# Patient Record
Sex: Female | Born: 1985 | Race: White | Hispanic: No | Marital: Single | Smoking: Never smoker
Health system: Southern US, Community
[De-identification: ages and names within clinical notes are randomized; demographics above are authoritative.]

## PROBLEM LIST (undated history)

## (undated) DIAGNOSIS — K5090 Crohn's disease, unspecified, without complications: Secondary | ICD-10-CM

## (undated) DIAGNOSIS — Z862 Personal history of diseases of the blood and blood-forming organs and certain disorders involving the immune mechanism: Secondary | ICD-10-CM

## (undated) DIAGNOSIS — M1990 Unspecified osteoarthritis, unspecified site: Secondary | ICD-10-CM

## (undated) HISTORY — DX: Crohn's disease, unspecified, without complications: K50.90

## (undated) HISTORY — DX: Unspecified osteoarthritis, unspecified site: M19.90

## (undated) HISTORY — DX: Personal history of diseases of the blood and blood-forming organs and certain disorders involving the immune mechanism: Z86.2

---

## 2007-11-17 ENCOUNTER — Encounter: Admission: RE | Admit: 2007-11-17 | Discharge: 2007-11-17 | Payer: Self-pay | Admitting: Sports Medicine

## 2007-11-18 ENCOUNTER — Encounter: Admission: RE | Admit: 2007-11-18 | Discharge: 2007-11-18 | Payer: Self-pay | Admitting: Sports Medicine

## 2008-02-06 ENCOUNTER — Inpatient Hospital Stay (HOSPITAL_COMMUNITY): Admission: EM | Admit: 2008-02-06 | Discharge: 2008-02-15 | Payer: Self-pay | Admitting: Emergency Medicine

## 2008-02-06 HISTORY — PX: BOWEL RESECTION: SHX1257

## 2008-02-09 ENCOUNTER — Encounter (INDEPENDENT_AMBULATORY_CARE_PROVIDER_SITE_OTHER): Payer: Self-pay | Admitting: General Surgery

## 2008-05-01 IMAGING — CT CT PELVIS LIMITED W/O CM
2 of 4 series · 15 of 42 positions shown, 19 images · non-contrast
Comparison: none

CLINICAL DATA: Crohn's disease. Pelvic abscess.

[Series 2: abd pelvis · axial · 0.70mm/px · z∈[-245,-102]mm · 12 of 58 slices shown, 16 images]
[im 5/58  soft-tissue]
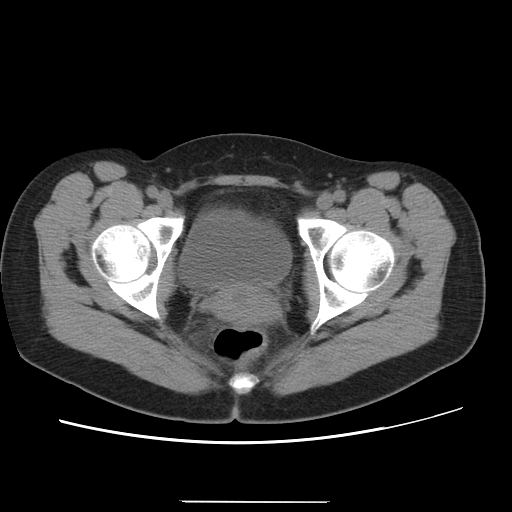
[im 5/58  bone]
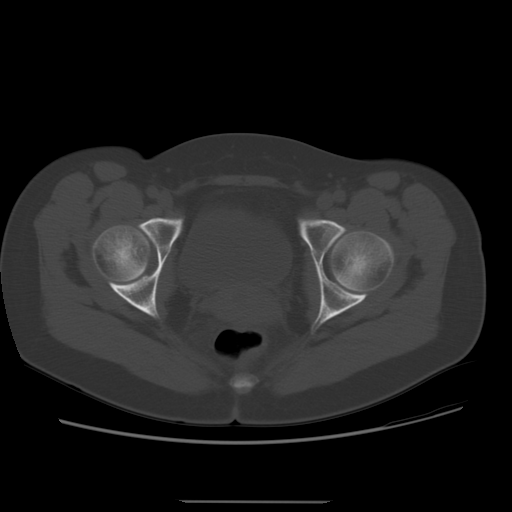
[im 10/58  soft-tissue]
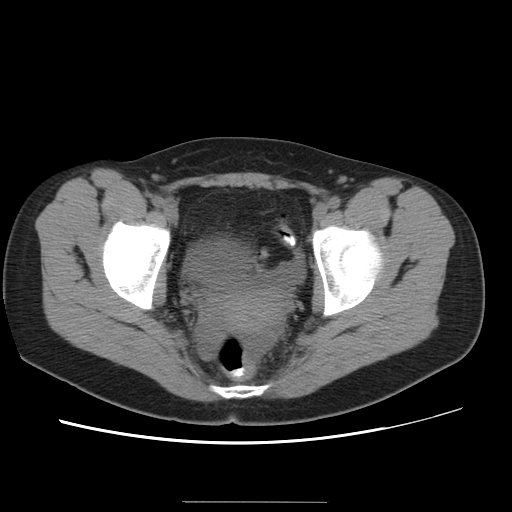
[im 15/58  soft-tissue]
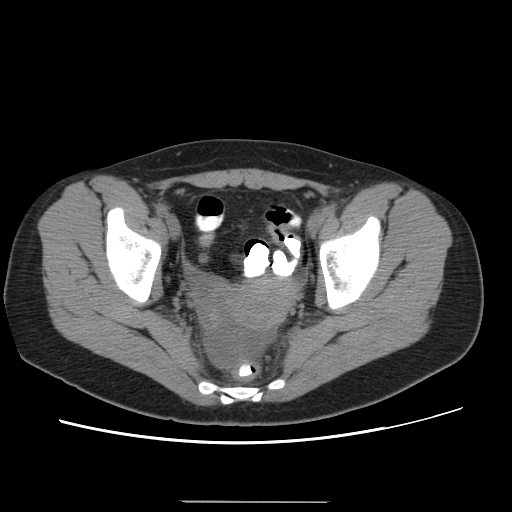
[im 20/58  soft-tissue]
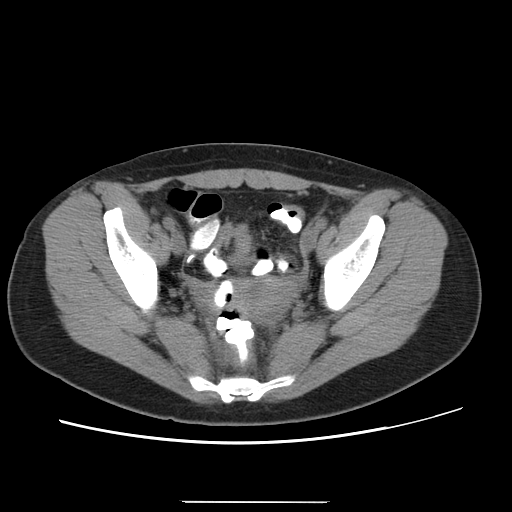
[im 25/58  soft-tissue]
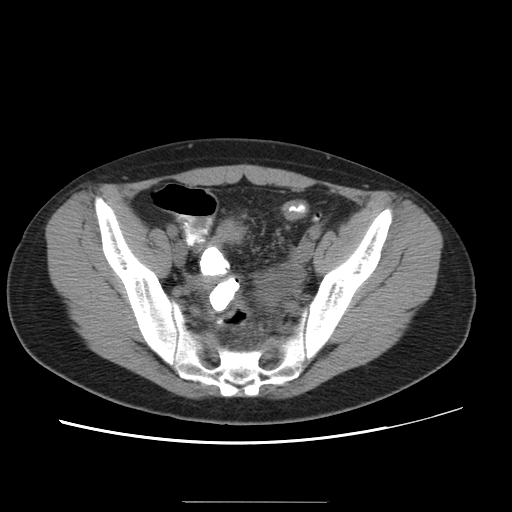
[im 33/58  soft-tissue]
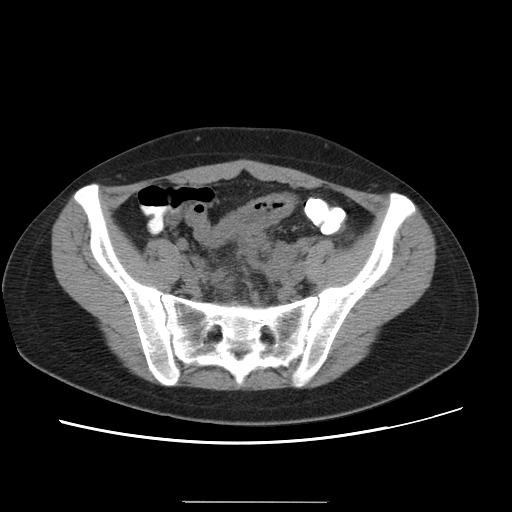
[im 38/58  soft-tissue]
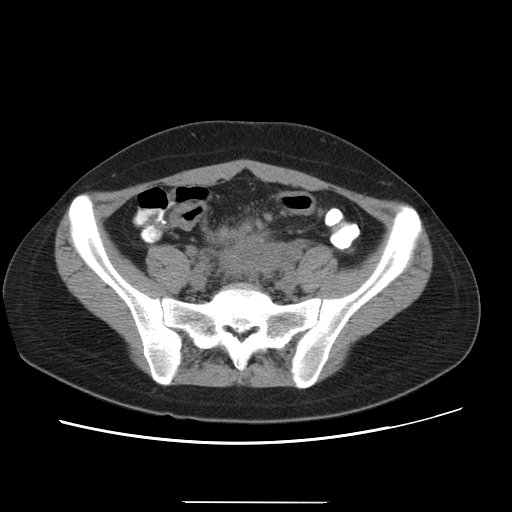
[im 43/58  soft-tissue]
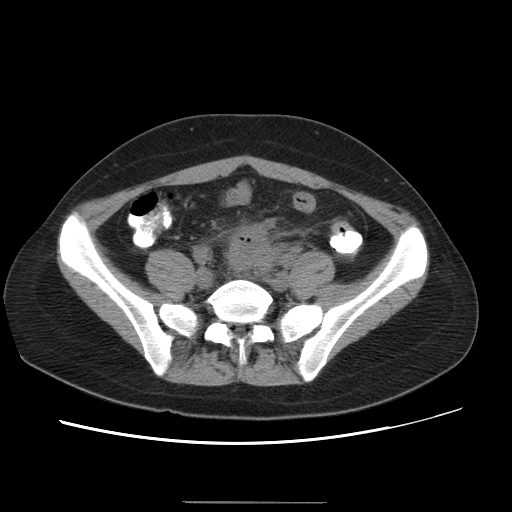
[im 48/58  soft-tissue]
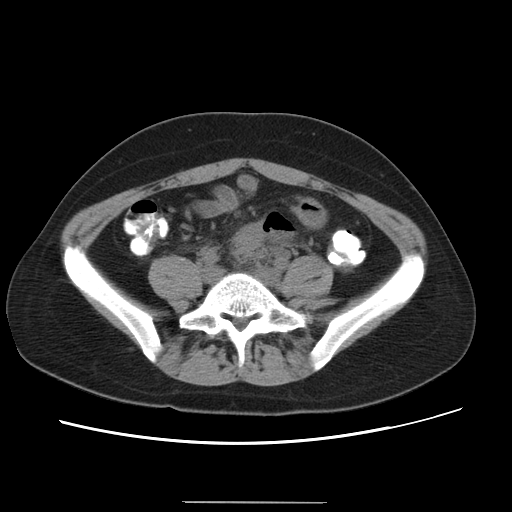
[im 48/58  lung]
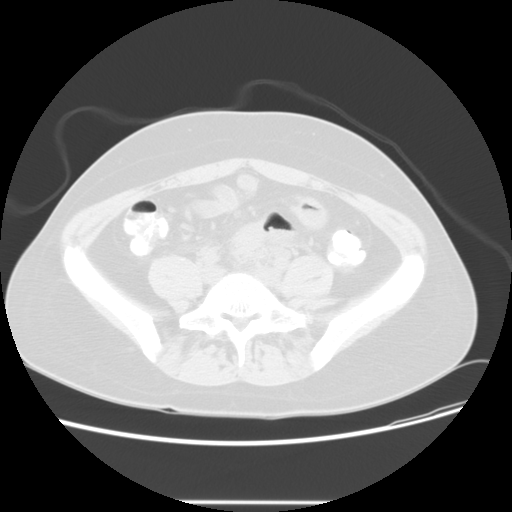
[im 48/58  bone]
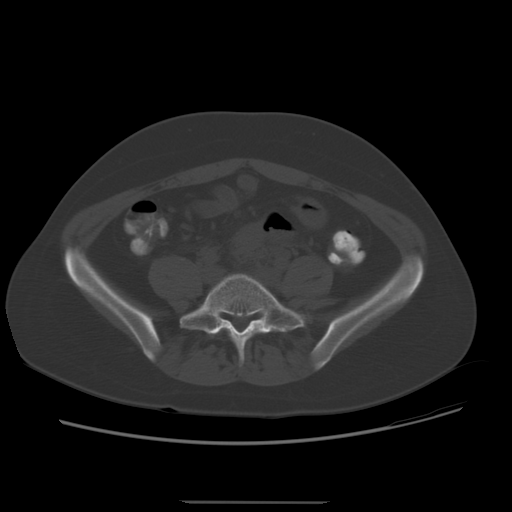
[im 50/58  lung]
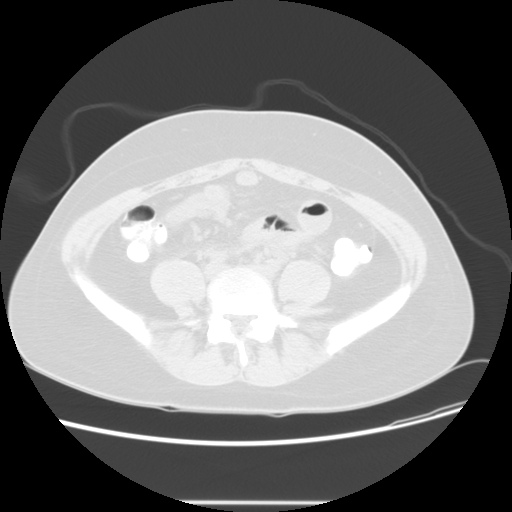
[im 53/58  soft-tissue]
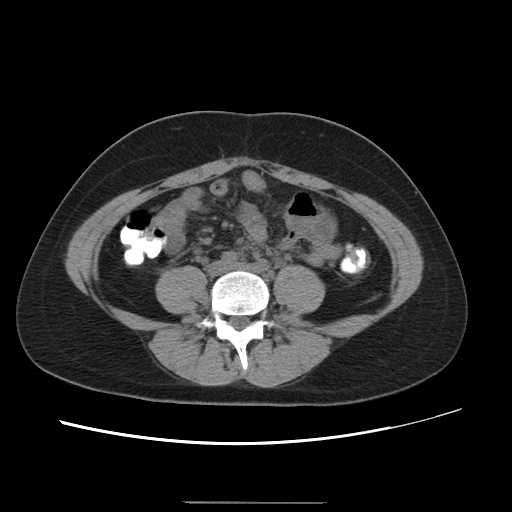
[im 53/58  lung]
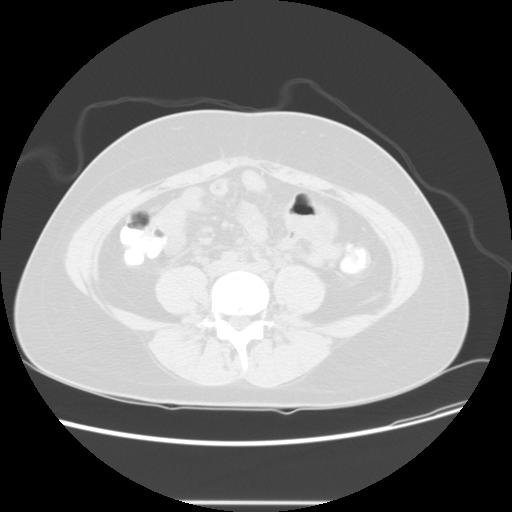
[im 55/58  lung]
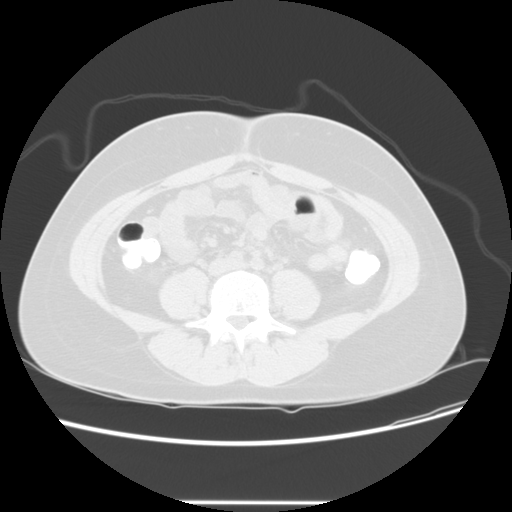

[Series 400: reformatted · sagittal · 0.70mm/px · 3 of 149 slices shown]
[im 30/149  soft-tissue]
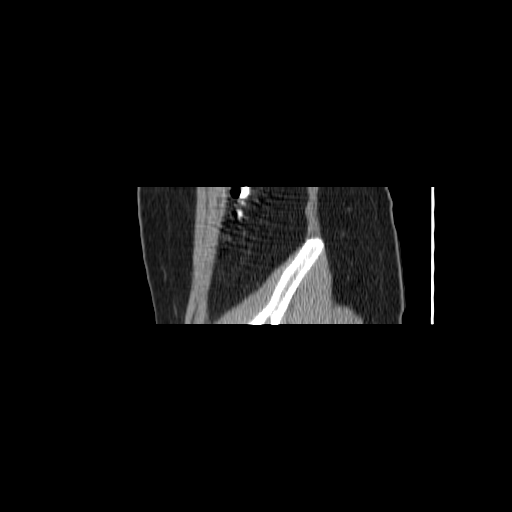
[im 60/149  soft-tissue]
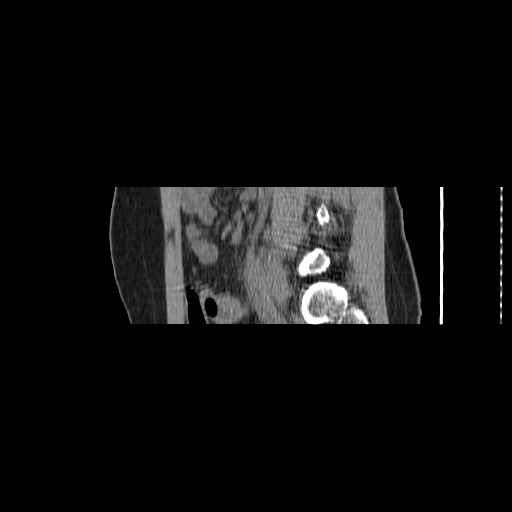
[im 89/149  soft-tissue]
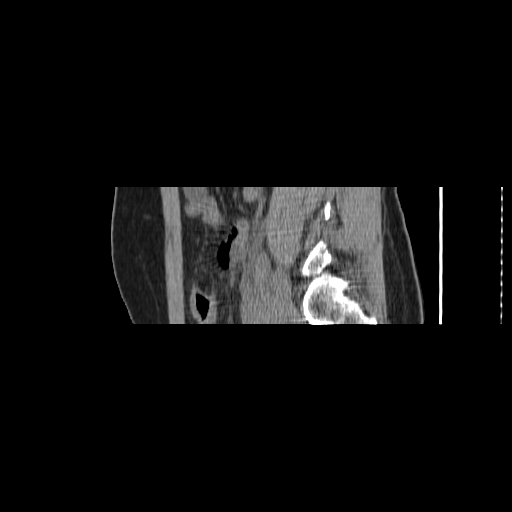

[15 of 42 positions shown; findings below may reference images not displayed]

CT pelvis limited without contrast:

Select axial scans were obtained through the pelvis in contemplation of
percutaneous abscess drainage. There has been interval decrease in size of the
central pelvic gas and fluid collection since the previous scan. The central low
attenuation component of the collection measures less than 2 cm maximum
transverse diameter. There are multiple loops of bowel overlying and surrounding
the residual collection. There is a small amount of free fluid in the low
pelvis.
IMPRESSION: 1. Interval decrease in size of central pelvic abscess, now too small to warrant
percutaneous drain catheter placement.

## 2010-01-10 ENCOUNTER — Ambulatory Visit: Payer: Self-pay | Admitting: Diagnostic Radiology

## 2010-01-10 ENCOUNTER — Ambulatory Visit (HOSPITAL_BASED_OUTPATIENT_CLINIC_OR_DEPARTMENT_OTHER): Admission: RE | Admit: 2010-01-10 | Discharge: 2010-01-10 | Payer: Self-pay | Admitting: Rheumatology

## 2010-04-04 IMAGING — CR DG CHEST 2V
2 series · 2 of 2 positions shown · non-contrast
Comparison: None

CLINICAL DATA: History given of sarcoidosis.  Beginning
immunosuppressant therapy.  Evaluate for findings of tuberculosis.

CHEST - 2 VIEW

[w chest pa]
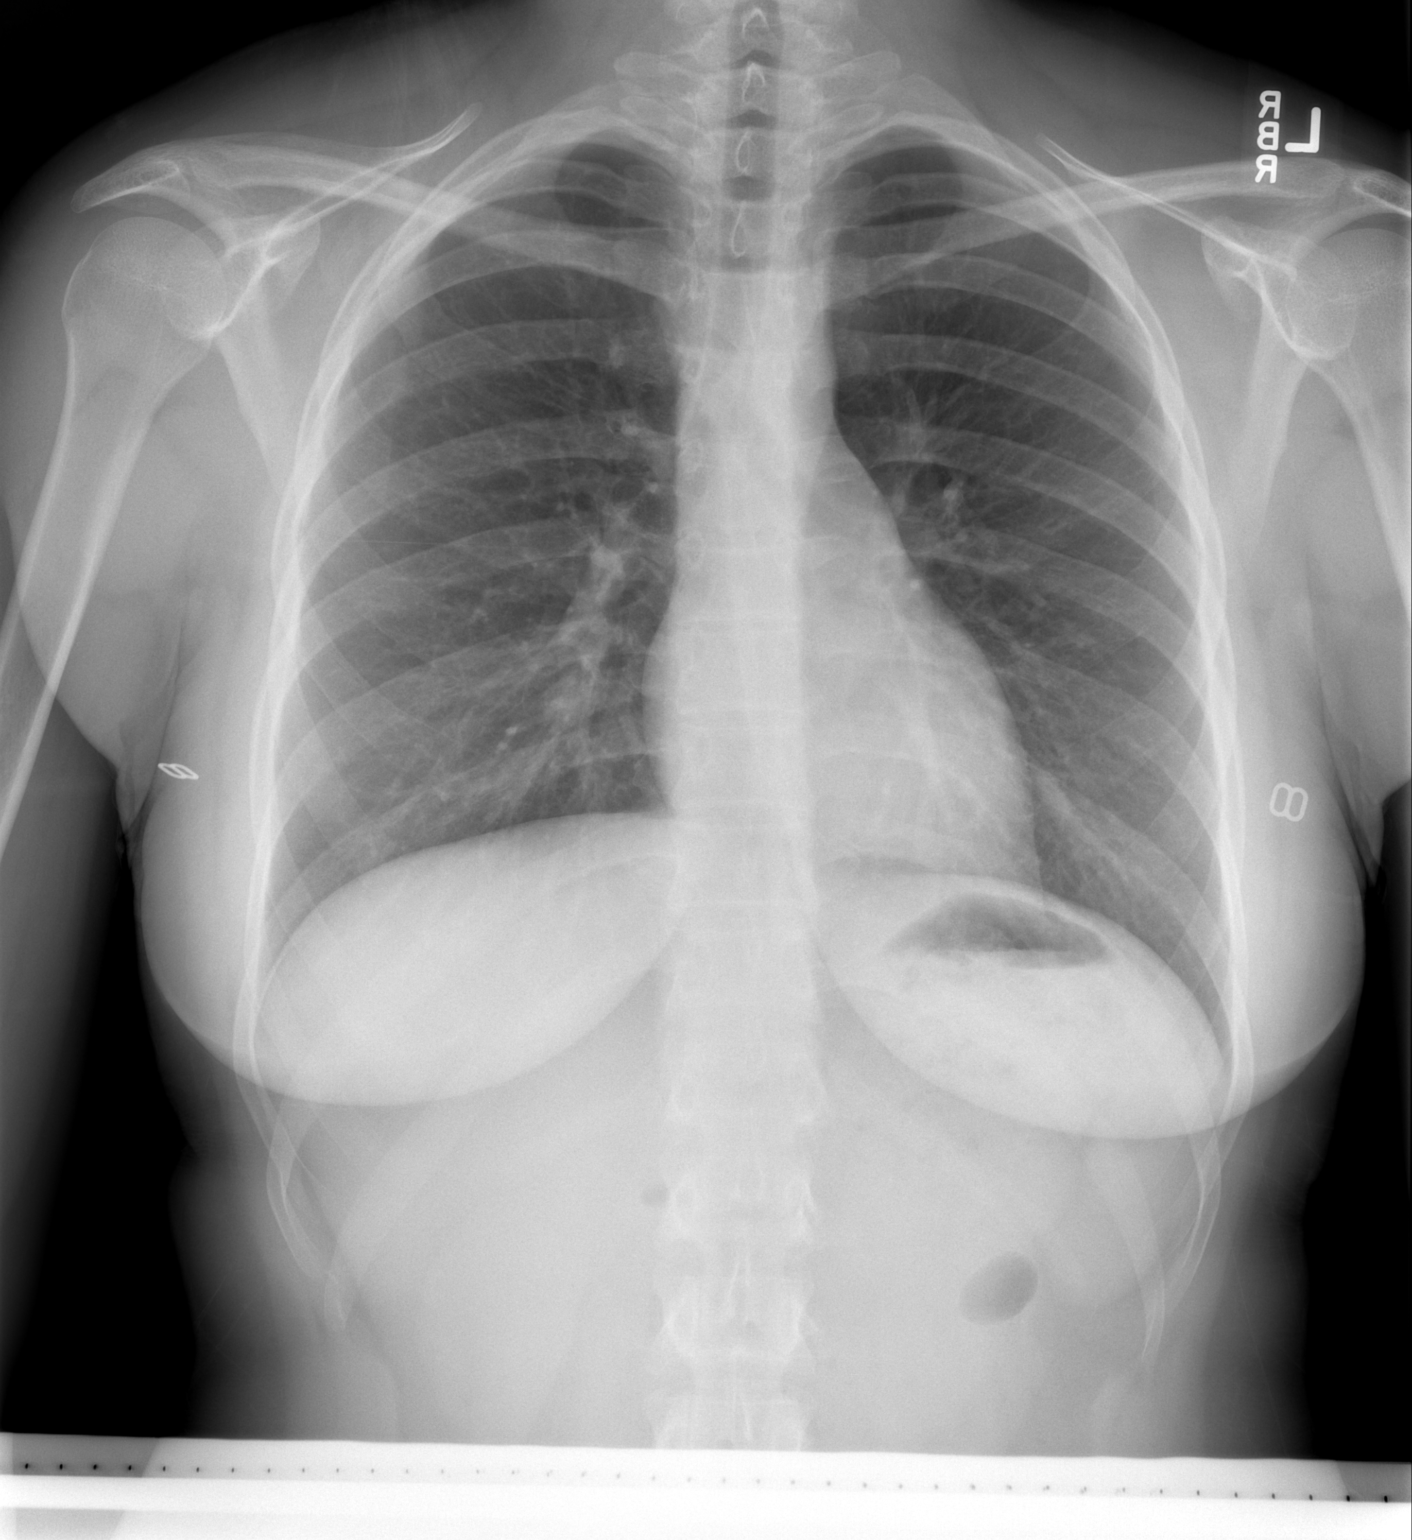

[w chest lat]
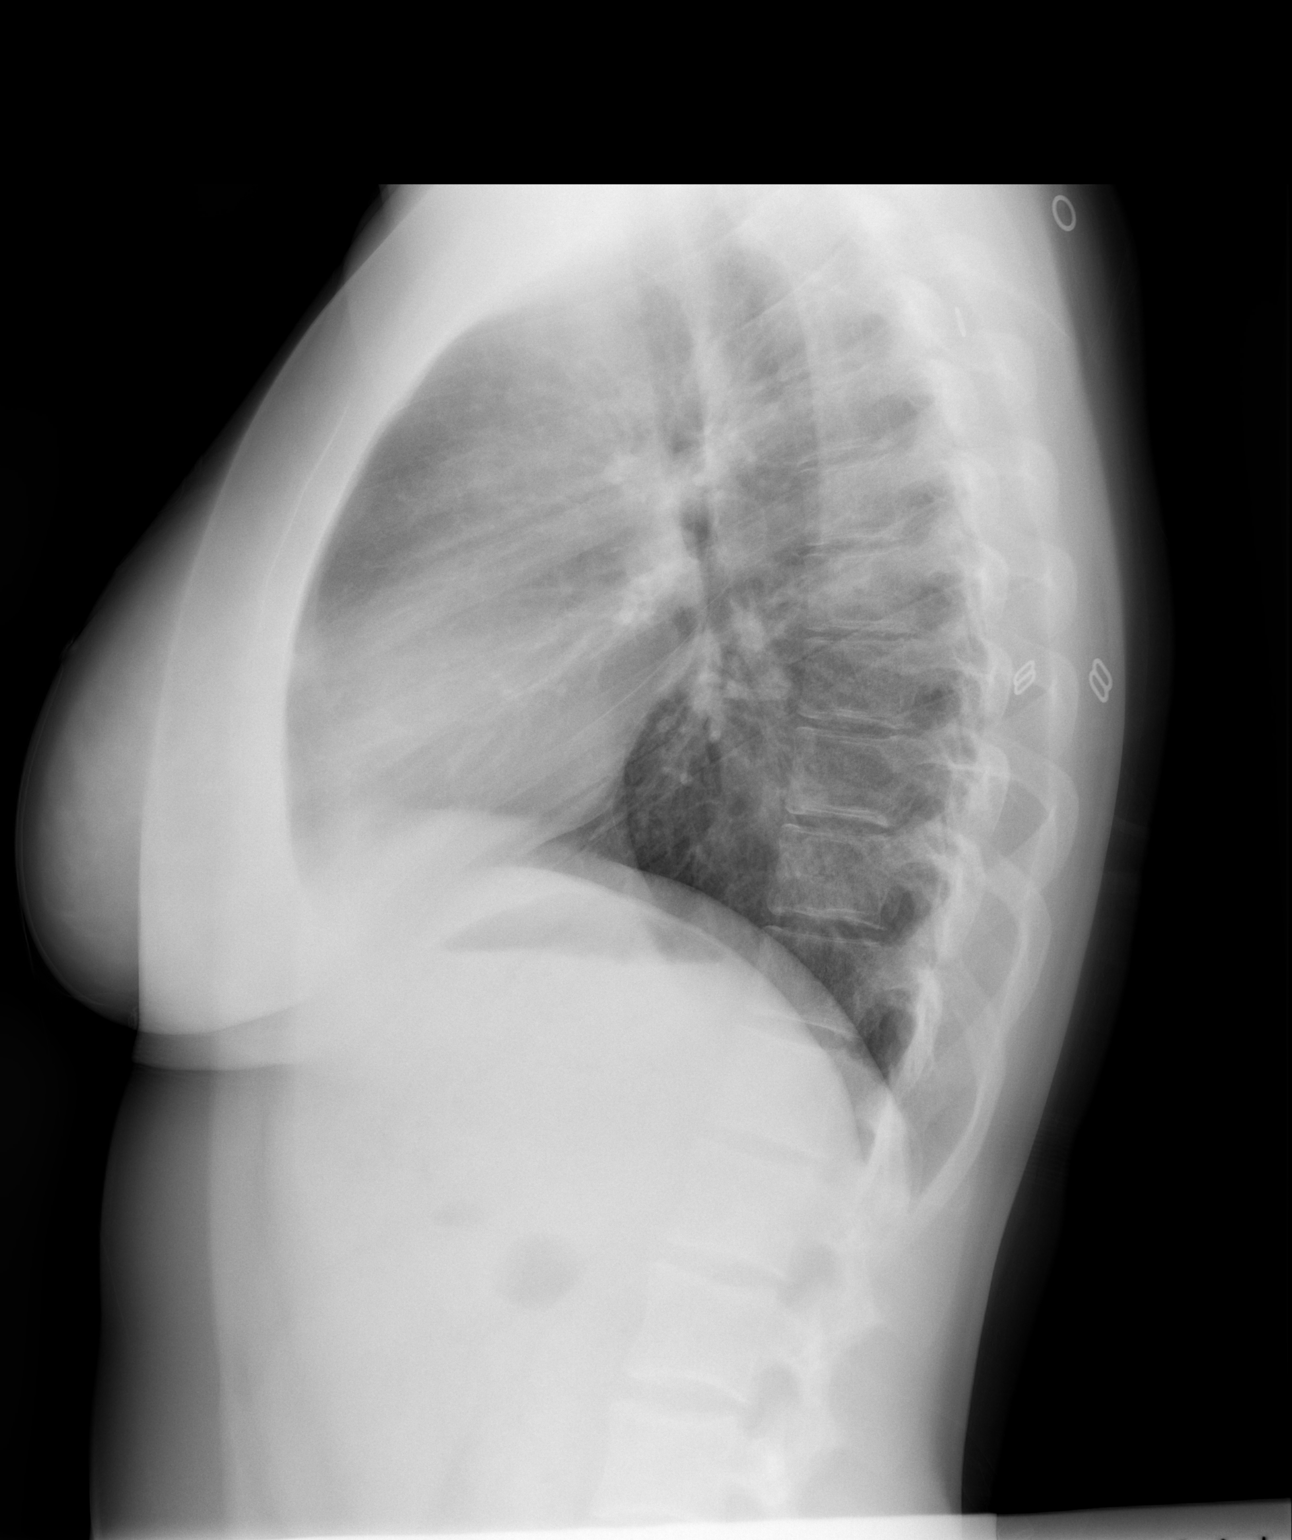

[2 of 2 positions shown; findings below may reference images not displayed]

FINDINGS: The cardiac silhouette is normal size and shape. No
adenopathy is evident. The lungs are well aerated and free of
infiltrates. No pleural abnormality is evident. Bones appear
average for age.
IMPRESSION: Normal chest examination.  There is no evidence of tuberculosis.

## 2010-12-29 ENCOUNTER — Encounter: Payer: Self-pay | Admitting: Gastroenterology

## 2011-04-22 NOTE — Op Note (Signed)
Amber Delgado, Amber NO.:  [id number]   MEDICAL RECORD NO.:  [id number]          PATIENT TYPE:  INP   LOCATION:  5013                         FACILITY:  [facility]   PHYSICIAN:  Gabrielle Dare. Janee Morn, M.D.DATE OF BIRTH:  [birth year]   DATE OF PROCEDURE:  02/09/2008  DATE OF DISCHARGE:                               OPERATIVE REPORT   PREOPERATIVE DIAGNOSIS:  Crohn's disease with terminal ileitis and  abscess.   POSTOPERATIVE DIAGNOSIS:  1. Crohn's disease with terminal ileitis and abscess.  2. Meckel's diverticulum.   PROCEDURE:  1. Ileocecectomy.  2. Meckel's diverticulectomy.   SURGEON:  Violeta Gelinas.   ANESTHESIA:  General.   HISTORY OF PRESENT ILLNESS:  Amber Delgado is a 25 year old white female,  who was recently diagnosed with Crohn's disease in December of 2008.  Since then she has had multiple flareups of severe terminal ileitis.  This most recent one including localized abscess.  Due to failure of  medical management, Dr. Laural Benes from gastroenterology recommended  proceeding with surgery.  She has been hospitalized since 02/06/2008, and  symptomatically improved on intravenous antibiotics.  We are proceeding  with ileocecectomy today.   PROCEDURE IN DETAIL:  Informed consent was obtained.  The patient was  identified in the preop holding area. She continues to receive  intravenous antibiotics. She was brought to the operating room. Her  abdomen was prepped and draped in a sterile fashion. A limited midline  incision. Subcutaneous tissues were dissected down to the anterior  fascia. This was divided sharply along the midline and the peritoneal  cavity was entered under direct vision. The fascia was opened to the  length of the skin incision. Exploration revealed some adherence of the  omentum down to the right lower quadrant. This was taken down with Bovie  cautery.  Further examination revealed significant inflammation of the  terminal ileum with a loop  stuck down into the pelvis. This extending  back approximately 20 cm. The remainder of the small bowel was free of  significant Crohn's disease and the right colon was normal as well. NG  tube was positioned in the stomach. Examination of the small bowel also  revealed a Meckel's diverticulum. There appeared to be some mild  inflammation near the tip so the decision was made to remove that later  in the case. The small bowel continued to be normal distal to the  Meckel's and a normal section proximal to the terminal ileitis was  selected. There was no evidence of Crohn's in this area. It was divided  with the GIA 75 stapler. The cecum was then freed from the lateral  peritoneal attachment carefully along the line of Toldt. Again, the  cecum appeared free from any inflammation. The appendix was also normal  in appearance. . Once the cecum was swept up, the small bowel mesentery  distal to the division, was then divided with the ligature achieving  excellent hemostasis.  A loop of the terminal ileum was stuck down in  the pelvis, this was gradually freed up. It was densely adherent and  inflamed. There was also  abscess cavity present. There was some venous  oozing in the abscess cavity. This was thoroughly irrigated. Hemostasis  was controlled with Bovie cautery and Surgicel.  Further careful gentle  dissection allowed this loop of terminal ileum to be brought up. The  mesentery was further divided along the bowel with the ligature.  We  then divided the cecum with the GIA 75 stapler into normal section and  the remainder of the mesentery of the cecum and the terminal ileum was  divided along the bowel wall by taking care to stay away from the ureter  and other portions of the retroperitoneum. We did stay along the bowel  and this completed removal of the specimen and it was passed off as was  the terminal ileum and cecum.  The area was copiously irrigated.  Hemostasis was obtained. . The  Surgicel down in the old abscess cavity  was changed and replaced with a new piece.  We then inspected the  Meckel's diverticulum. This was removed with a firing of the GIA stapler  across the base. This did not narrow the lumen of the bowel and there  was excellent hemostasis along the staple line that was sent as a  separate specimen. The ileum and the remaining right colon were viable  and intact. Side-to-side anastomosis was then made with the GIA 75  stapler of the terminal ileum to the right colon. The staple line within  the bowel was checked. There was no bleeding within the lumen. The  resultant enterotomy was closed with a TX60 stapler achieving excellent  closure. There was a widely patent palpable anastomosis. Our gloves were  changed. The abdomen was copiously irrigated with saline. Hemostasis was  ensured down in the pelvis where the abscess was  The mesenteric defect  was closed with interrupted figure-of-eight 2-0 silk sutures, and again  staple lines were all intact and hemostatic. The abdomen was again  copiously irrigated with warm saline. Irrigation fluid returned clear.  The bowel was returned to anatomic position. The omentum was brought  down and covered over the anastomosis area and laid down into the  pelvis. The remainder of the irrigation fluid was evacuated and it was  clear. Primary sponge, needle and instrument counts were correct. The  fascia was closed at two  lengths with running #1 looped PDS and tied in  the middle. Subcutaneous tissues were irrigated and the skin was closed  with staples. Sponge, needle and instrument counts were again correct.  Sterile dressing was applied. The patient tolerated the procedure well  without apparent complications. She was taken to the recovery room in  stable condition.      Gabrielle Dare Janee Morn, M.D.  Electronically Signed     BET/MEDQ  D:  02/09/2008  T:  02/09/2008  Job:  [id number]   cc:   Danise Edge, M.D.   Hollice Espy, M.D.

## 2011-04-22 NOTE — Consult Note (Signed)
Amber Delgado, LYE NO.:  [id number]   MEDICAL RECORD NO.:  [id number]          PATIENT TYPE:  INP   LOCATION:  5013                         FACILITY:  [facility]   PHYSICIAN:  Shirley Friar, MDDATE OF BIRTH:  July 27, 1986   DATE OF CONSULTATION:  02/06/2008  DATE OF DISCHARGE:                                 CONSULTATION   INDICATIONS:  Fever, abscess on CT scan.   HISTORY OF PRESENT ILLNESS:  A 25 year old pleasant white female with  Crohn's disease diagnosed in December 2008, who has been on prednisone  since mid-December 2008.  Her Crohn's disease is located in her terminal  ileum on a colonoscopy done in January 2009.  She presents with fever up  to 103 starting on February 05, 2008, along with abdominal pain.  She  has also been having some loose stools without any rectal bleeding.  She  denies any vomiting, but has been having some nausea.  A CT scan was  done on presentation which revealed a 4.9 cm abscess near her terminal  ileum.  She was tachycardic on presentation, with heart rate in the 120s-  150s.  She does have leukocytosis, with white blood count of 21,000,  with 88% neutrophils.   PAST MEDICAL HISTORY:  Negative, except as stated above   MEDICATIONS:  Prednisone 20 mg p.o. daily.   ALLERGIES:  PENICILLINS.   FAMILY HISTORY:  Noncontributory.   SOCIAL HISTORY:  Consulting civil engineer at [employer].  Denies alcohol, drugs, or smoking.   REVIEW OF SYSTEMS:  Negative, except as stated above.   PHYSICAL EXAMINATION:  VITAL SIGNS:  Temperature is 99.2, pulse 150,  blood pressure 131/85.  GENERAL:  Alert, no acute distress.  ABDOMEN:  Tender in right lower quadrant with guarding, positive bowel  sounds, soft, mild distention.   LABORATORIES:  White blood count 21.3, hemoglobin 12.6, platelet count  279.  Lipase 14.   IMPRESSION:  A 21-year white female with Crohn's disease of her terminal  ileum diagnosed in December 2008, who has been on prednisone since  that  time, who presents with fever, tachycardia, abdominal pain, and CT  evidence of an approximately 5 cm abscess seen on recent CT scan.  In  December 2008, she had a 3.6 cm ill-defined inflammatory area noted on  CT scan at that time in the same area as this abscess which was noted on  recent CT scan.  For her abscess in the setting of her Crohn's disease  and chronic steroid use, we will plan to give IV antibiotics and see  about percutaneous drainage by radiology to see if her fluid is  infected.  Will recommend bowel rest except for ice chips and sips of  clears at this time.  The patient will be admitted to the [facility] and  will continue to receive antiemetics as needed and pain medicines as  needed.  The patient reports that she was scheduled to have a small-  bowel follow-through as an outpatient to be done later this week.  Most  likely, this will have to be postponed until further  workup and  management of this abscess is done.      Shirley Friar, MD  Electronically Signed     VCS/MEDQ  D:  02/06/2008  T:  02/07/2008  Job:  [id number]   cc:   Danise Edge, M.D.  Dario Guardian, M.D.

## 2011-04-22 NOTE — Discharge Summary (Signed)
Amber Delgado, Amber Delgado                ACCOUNT NO.:  [id number]   MEDICAL RECORD NO.:  [id number]          PATIENT TYPE:  INP   LOCATION:  5013                         FACILITY:  [facility]   PHYSICIAN:  Hollice Espy, M.D.DATE OF BIRTH:  06-May-1986   DATE OF ADMISSION:  02/06/2008  DATE OF DISCHARGE:  02/15/2008                               DISCHARGE SUMMARY   PRIMARY CARE PHYSICIAN:  Dario Guardian, M.D.   CONSULTANTS ON THIS CASE:  1. Gabrielle Dare. Janee Morn, M.D., surgery.  2. Danise Edge, M.D., [facility].   DISCHARGE DIAGNOSES:  1. Crohn's disease, with acute flare-up and terminal ileitis.  2. Secondary abscess.  3. Status post ileocecotomy.  4. Nausea secondary to pain medication and antibiotics.   DISCHARGE MEDICATIONS:  1. To continue her p.r.n., Zyrtec and Claritin.  2. She is discharged on prednisone 20 mg p.o. daily x1 week, then 15      mg p.o. daily x1 week, then 10 mg p.o. daily x1 week, then stop.  3. She is also being discharged on Vicodin 5/325, 1 tablet p.o. q.4 h.      p.r.n.  4. Ibuprofen 1-2 tablets p.o. q.6-8 h. p.r.n.   DISCHARGE DIET:  No restriction.   DISPOSITION:  Improved.   ACTIVITY:  The patient is to increased activity slowly.  May walk up  steps.  May shower and bathe for 4 weeks.  No lifting for 5 weeks,  greater than 15 pounds.  No driving for 2-4 weeks.   FOLLOW-UP APPOINTMENTS:  1. She is going to follow up with Dr. Violeta Gelinas in 2 weeks.  Call      for appointment.  2. She is to follow up with Dr. Danise Edge, her [facility] GI doctor,      when school term ends.  3. She will follow up with her PCP, Dr. Katrinka Blazing, as needed.   She will return to school on February 21, 2008 when the school semester  resumes after spring break.   HOSPITAL COURSE:  The patient is a 25 year old white female with a past  medical history of Crohn's disease who follows with Dr. Danise Edge  at [facility], who has been on prednisone since mid-December 2008.  The  patient on the day prior to admission started having increased fevers,  abdominal pain, and loose stools, but no rectal bleeding.  A CT scan  done showed a 4.9 cm abscess near her terminal ileum, and she was found  to have a white count of 21,000.  She was brought into the hospital, and  Dr. Janee Morn from surgery was consulted to evaluate the patient.  The  patient was started on IV Cipro and Flagyl for gut coverage.  Her  steroid dose was increased, and Dr. Janee Morn evaluated the patient for  ileocecotomy.  The patient underwent several days of IV antibiotics.  Her white count improved, and interventional radiology saw the patient  on February 08, 2008 for placement of drain. However when they attempted to  do this drain, there was not enough fluid found to be able to be  drained,  and the procedure was terminated.  The patient then, after  being evaluated by surgery, underwent an ileocecotomy on February 10, 2008.  She tolerated the procedure well.  Postop, she received a several days  of IV Cipro and Flagyl, and by February 14, 2008 these medication were able  to be discontinued.  The patient was continued on increased-dose  prednisone.  Her diet was able to be advanced eventually to a soft bland  diet, which she tolerated, and by February 15, 2008 she was felt to be  better from a medical and surgical standpoint and able to be safely  discharged.  She is doing well, and follow-up appointments will be as  above.      Hollice Espy, M.D.  Electronically Signed     SKK/MEDQ  D:  02/15/2008  T:  02/16/2008  Job:  [id number]   cc:   Gabrielle Dare. Janee Morn, M.D.  Danise Edge, M.D.  Dario Guardian, M.D.

## 2011-04-22 NOTE — H&P (Signed)
NAMEKAMILLAH, DIDONATO                ACCOUNT NO.:  [id number]   MEDICAL RECORD NO.:  [id number]          PATIENT TYPE:  INP   LOCATION:  5013                         FACILITY[facility]  [facility]   PHYSICIAN:  Michiel Cowboy, MDDATE OF BIRTH:  [birth year]   DATE OF ADMISSION:  02/06/2008  DATE OF DISCHARGE:                              HISTORY & PHYSICAL   CHIEF COMPLAINT:  Abdominal and back pain.   PRIMARY CARE PHYSICIAN:  Dario Guardian, M.D.   GASTROENTEROLOGIST:  Danise Edge, M.D.   HISTORY OF PRESENT ILLNESS:  This is a 25 year old female with history  of Crohn's recently diagnosed, has been on prednisone since December,  presented with worsening abdominal pain as well as back pain.  A CT scan  of the abdomen was obtained in the emergency department that showed a  4.9 cm abscess adjacent to terminal ileum as well as inflammatory  changes of terminal ileum.  Gastroenterology was called to consult and  [facility] was called to admit the patient.   REVIEW OF SYSTEMS:  Significant for occasional fever, occasional joint  pain, occasional diarrhea that has currently resolved, occasional blood  in stools that is currently resolved.  Otherwise negative.   PAST MEDICAL HISTORY:  1. Significant for Crohn's disease on prednisone.  2. History of ALLERGIES.  3. History of anxiety.   ALLERGIES:  PENICILLIN.   MEDICATIONS:  Prednisone 20 mg p.o. daily.   PHYSICAL EXAMINATION:  VITAL SIGNS:  Temperature 99.2, heart rate 150,  after some fluids went down to 120's.  Blood pressure 131/85.  Respirations 23.  Sating 98% on room air.  GENERAL:  Overall, this is a pleasant female in no acute distress.  HEENT:  Head nontraumatic.  Moist mucous membranes.  PERRLA.  NECK:  No lymphadenopathy noted.  LUNGS:  Clear to auscultation bilaterally.  HEART:  Regular rate and rhythm with no murmurs, rubs or gallops.  ABDOMEN:  Somewhat firm with tenderness in the left lower quadrant and  overall  tenderness.  EXTREMITIES:  Lower extremities without edema.  NEUROLOGICAL:  Intact.   LABORATORY DATA:  White blood cell count 21.3, hemoglobin 12.6, platelet  count 279,000.  Sodium 134, creatinine 1.03.  Liver function tests  within normal limits.  Lipase within normal limits.  Urinalysis  negative.  Urine pregnancy test negative.  CT scan of abdomen shows 4.9  cm abscess next to the terminal  ileum.  Of note, the patient has had a  recent CT scan in December as well showing some inflammation around the  same area but back then it was not determined to be an abscess.   ASSESSMENT/PLAN:  This is a 25 year old female with history of Crohn's  now with intra-abdominal abscess.  1. Intra-abdominal abscess.  We will admit the patient for intravenous      antibiotics.  Will put her on Avelox and Flagyl as per Dr.      Bosie Clos.  Will have interventional radiology perform drainage of      the abscess in the morning.  Will keep patient NPO post midnight  but give sips of fluid for today.  Given the patient was on      steroids for a prolonged period of time, will decrease the steroids      and will try to do this as soon as possible given acute infection.      Will decrease today to 15 mg p.o. daily and continue to decrease      thereafter.  2. Tachycardia.  Likely secondary to dehydration.  Will put patient on      intravenous fluids.  3. Prophylaxis.  SCD's and Protonix.      Michiel Cowboy, MD  Electronically Signed     AVD/MEDQ  D:  02/06/2008  T:  02/07/2008  Job:  [id number]   cc:   Dario Guardian, M.D.  Danise Edge, M.D.  Shirley Friar, MD

## 2011-04-22 NOTE — Consult Note (Signed)
NAMELOWELL, MCGURK NO.:  [id number]   MEDICAL RECORD NO.:  [id number]          PATIENT TYPE:  INP   LOCATION:  5013                         FACILITY:  [facility]   PHYSICIAN:  Gabrielle Dare. Janee Morn, M.D.DATE OF BIRTH:  06-18-1986   DATE OF CONSULTATION:  DATE OF DISCHARGE:                                 CONSULTATION   CHIEF COMPLAINT:  Crohn disease with abscess of the terminal ileum.   HISTORY OF PRESENT ILLNESS:  We were asked by Dr. Reece Agar to  evaluate this pleasant 25 year old white female in regards to Crohn  disease with abscess.  She has a recent diagnosis in December 2008.  She  has been treated medically with steroids.  Over the weekend she  developed fever and infraumbilical right lower quadrant abdominal pain.  She was admitted to the hospital.  A workup demonstrates terminal  ileitis with  4.9-cm abscess on initial CT.  She has been treated with  steroids and IV antibiotics.  Followup scan for placement of drain today  demonstrated significant decrease in the size of her abscess, now two  small to percutaneously drain.  She is still having some mild pain.  We  were asked to evaluate for consideration of ileocecectomy.  While the  patient has a recent diagnosis of Crohn's, she does note some abdominal  pain and diarrhea intermittently throughout her senior year of high  school about 2 years ago.  Things had recently become better from that  standpoint up until this latest exacerbation.   PAST MEDICAL HISTORY:  1. Crohn's disease.  2. Anxiety disorder.   PAST SURGICAL HISTORY:  None.   ALLERGIES:  PENICILLIN.   CURRENT MEDICATIONS:  Avelox and Flagyl intravenously as well as  prednisone.   FAMILY HISTORY:  Unremarkable.   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   REVIEW OF SYSTEMS:  CONSTITUTIONAL:  Negative.  EARS/EYES/NOSE/THROAT:  Negative.  NECK:  Negative.  PULMONARY:  Negative.  CARDIOLOGY:  She has  had  some noted mild tachycardia this admission.  GI:  As above.  GU:  Negative.  MUSCULOSKELETAL:  Negative.  NEURO/PSYCH:  Negative acute.   PHYSICAL EXAMINATION:  VITAL SIGNS:  Temperature l97.7, blood pressure  122/73, heart rate 120, respirations 18.  GENERAL:  She is a pleasant female.  She appears her stated age.  HEENT:  Within normal limits.  NECK:  Supple.  Trachea is midline.  LYMPH:  Reveals no supraclavicular, cervical, periumbilical, or inguinal  lymphadenopathy.  LUNGS:  Clear to auscultation with no wheezing present and good  respiratory effort.  CARDIOVASCULAR:  Heart is regular, slightly tachycardic around 110.  Distal pulses are full.  ABDOMEN:  Soft.  She has bowel sounds present.  She has tenderness in  the infraumbilical region and in the right lower quadrant with no  guarding.  No discrete masses are palpable, though there is some  fullness in the right lower quadrant.  MUSCULOSKELETAL:  Upper and lower extremities are without deformity or  tenderness.  NEUROLOGIC:  She is oriented and speech is appropriate.  She moves all  extremities well.   DATA REVIEW:  White blood cell count today is down to 10,000, hemoglobin  14.7.  INR 1.2.  CT scan as described above.   IMPRESSION:  Crohn's disease with terminal ileitis and associated  abscess.   PLAN:  I agree with medical management including steroids and IV  antibiotics per Dr. Laural Benes from the GI service so far.  I will discuss  with him in detail the optimal timing of ileocecectomy for this patient.  The plan was discussed in detail with the patient and her parents and we  will follow.      Gabrielle Dare Janee Morn, M.D.  Electronically Signed     BET/MEDQ  D:  02/07/2008  T:  02/07/2008  Job:  [id number]   cc:   Danise Edge, M.D.

## 2011-09-01 LAB — CBC
HCT: 30.5 — ABNORMAL LOW
Hemoglobin: 10.1 — ABNORMAL LOW
Hemoglobin: 10.7 — ABNORMAL LOW
Hemoglobin: 12.6
Hemoglobin: 9.7 — ABNORMAL LOW
MCHC: 32.7
MCHC: 33
MCHC: 33.3
Platelets: 232
Platelets: 241
Platelets: 279
RBC: 3.4 — ABNORMAL LOW
RBC: 3.53 — ABNORMAL LOW
RBC: 3.74 — ABNORMAL LOW
RBC: 3.76 — ABNORMAL LOW
RBC: 4.39
RDW: 15.3
RDW: 15.7 — ABNORMAL HIGH
RDW: 15.8 — ABNORMAL HIGH
WBC: 11.8 — ABNORMAL HIGH
WBC: 7.3
WBC: 7.6

## 2011-09-01 LAB — URINALYSIS, ROUTINE W REFLEX MICROSCOPIC
Glucose, UA: NEGATIVE
Nitrite: NEGATIVE
Specific Gravity, Urine: 1.008
pH: 6.5

## 2011-09-01 LAB — COMPREHENSIVE METABOLIC PANEL
ALT: 23
AST: 18
Albumin: 2.6 — ABNORMAL LOW
Albumin: 3.5
Alkaline Phosphatase: 31 — ABNORMAL LOW
BUN: 10
BUN: 5 — ABNORMAL LOW
Chloride: 103
Creatinine, Ser: 1.03
GFR calc Af Amer: >60
Glucose, Bld: 176 — ABNORMAL HIGH
Potassium: 3.7
Potassium: 5.6 — ABNORMAL HIGH
Sodium: 136
Total Bilirubin: 1.2
Total Bilirubin: 1.6 — ABNORMAL HIGH
Total Protein: 5.2 — ABNORMAL LOW
Total Protein: 7.1

## 2011-09-01 LAB — BASIC METABOLIC PANEL
BUN: 5 — ABNORMAL LOW
BUN: 9
CO2: 22
CO2: 30
Calcium: 8.4
Calcium: 8.8
Creatinine, Ser: 0.79
Creatinine, Ser: 0.86
Creatinine, Ser: 0.93
GFR calc Af Amer: >60
GFR calc non Af Amer: >60
GFR calc non Af Amer: >60
GFR calc non Af Amer: >60
Glucose, Bld: 76
Glucose, Bld: 85
Potassium: 3.9
Sodium: 141

## 2011-09-01 LAB — DIFFERENTIAL
Basophils Absolute: 0
Basophils Absolute: 0
Basophils Relative: 0
Basophils Relative: 0
Eosinophils Relative: 0
Lymphocytes Relative: 22
Lymphocytes Relative: 25
Lymphs Abs: 1.8
Lymphs Abs: 2.2
Monocytes Absolute: 0.6
Monocytes Relative: 9
Monocytes Relative: 9
Neutro Abs: 18.6 — ABNORMAL HIGH
Neutro Abs: 4.7
Neutro Abs: 6.8
Neutrophils Relative %: 65

## 2011-09-01 LAB — CULTURE, BLOOD (ROUTINE X 2): Culture: NO GROWTH

## 2011-09-01 LAB — APTT: aPTT: 32

## 2011-09-01 LAB — MISCELLANEOUS TEST

## 2011-09-01 LAB — PROTIME-INR: INR: 1.2

## 2011-09-01 LAB — LIPASE, BLOOD: Lipase: 14

## 2013-04-25 ENCOUNTER — Telehealth: Payer: Self-pay | Admitting: Obstetrics & Gynecology

## 2013-04-25 NOTE — Telephone Encounter (Signed)
Patient is having difficulty with getting her birth control refills.

## 2013-04-25 NOTE — Telephone Encounter (Signed)
Patient states she called in earlier but no phone note in epic. [facility] [place] [facility] for generess needed. Patient also wants to know if we have any samples please?

## 2013-04-25 NOTE — Telephone Encounter (Signed)
Prior authorization form in your office to sign for Generess FE CHW. [name]

## 2013-04-26 NOTE — Telephone Encounter (Signed)
Prior auth. Forms signed per Dr. Farrel Gobble. Faxed to [facility]

## 2013-04-26 NOTE — Telephone Encounter (Signed)
Patient calling to check status of prior authorization for her prescription.

## 2013-04-28 ENCOUNTER — Telehealth: Payer: Self-pay | Admitting: Gynecology

## 2013-04-28 NOTE — Telephone Encounter (Signed)
Prior Berkley Harvey was denied today [facility] stated she needs to try 3 other  formulary medications and fail. Patient and mother are aware and want to know if there is something comparible that we have samples of since she has been out for a week now. Please advise.

## 2013-04-28 NOTE — Telephone Encounter (Signed)
Patient was denied particular birth control prescription issued by Dr.Lathrop, so is hoping to issue a new prescription. She said that she received a call from [place] and is returning the phone call.

## 2013-04-28 NOTE — Telephone Encounter (Signed)
Patient's mom calling re: Generess FE chewable needing prior auth. Patients mom requesting a call back from nurse as they have been trying to get this "for over a week".

## 2013-04-28 NOTE — Telephone Encounter (Signed)
Pt's mom spoke to [place], and [place] forwarded question to Dr. Farrel Gobble about alternative bc

## 2013-04-29 ENCOUNTER — Telehealth: Payer: Self-pay | Admitting: *Deleted

## 2013-04-29 MED ORDER — NORETHINDRONE ACET-ETHINYL EST 1.5-30 MG-MCG PO TABS: 1.0000 | ORAL_TABLET | Freq: Every day | ORAL | Status: DC

## 2013-04-29 NOTE — Telephone Encounter (Signed)
Call to patient to advise that we need list of preferred meds from ins company so we will know what we can change her to. Call disconnected while giving fax number so called back to patietn and mother and left this info with fax number on both VM.  *There is an additional phone note with this same info* See other phone message.

## 2013-04-29 NOTE — Telephone Encounter (Signed)
Can we see if lomedia is on her plan?  If not we can try Loestrin 1.5/30, we  Have no samples

## 2013-04-29 NOTE — Telephone Encounter (Signed)
Spoke to pt's mom and advised on TL's recommendation to check on lomedia or loestrin 1.5/30. She will check with insurance and call back.

## 2013-04-29 NOTE — Telephone Encounter (Signed)
Change in medication due to insurance sent Loestrin 1.5/30 3 packs with 1 rf that will carry patient to next annual. Patient notified.

## 2013-04-29 NOTE — Telephone Encounter (Signed)
Patients mother Amber Delgado called because former prescription was denied by insurance company, so new prescription needs to be issued. She is a pt of Dr.Miller's buts he saw Dr.Tracy Lathrop while Dr.Miller was on her leave of absence.

## 2013-04-29 NOTE — Telephone Encounter (Signed)
Spoke with pt's mom, Synetta Fail about covered OCP options. Generic loestrin FE is covered at no cost on their plan. Lomedia still has a $60 copay, which is too much. Pharmacy [facility]

## 2013-08-25 ENCOUNTER — Encounter: Payer: Self-pay | Admitting: Obstetrics & Gynecology

## 2013-09-22 ENCOUNTER — Encounter: Payer: Self-pay | Admitting: Obstetrics & Gynecology

## 2013-09-23 ENCOUNTER — Encounter: Payer: Self-pay | Admitting: Obstetrics & Gynecology

## 2013-09-23 ENCOUNTER — Ambulatory Visit (INDEPENDENT_AMBULATORY_CARE_PROVIDER_SITE_OTHER): Payer: 59 | Admitting: Obstetrics & Gynecology

## 2013-09-23 VITALS — BP 122/70 | HR 84 | Resp 18 | Ht 65.0 in | Wt 151.0 lb

## 2013-09-23 DIAGNOSIS — K5090 Crohn's disease, unspecified, without complications: Secondary | ICD-10-CM | POA: Insufficient documentation

## 2013-09-23 DIAGNOSIS — Z01419 Encounter for gynecological examination (general) (routine) without abnormal findings: Secondary | ICD-10-CM

## 2013-09-23 DIAGNOSIS — M064 Inflammatory polyarthropathy: Secondary | ICD-10-CM

## 2013-09-23 DIAGNOSIS — M1990 Unspecified osteoarthritis, unspecified site: Secondary | ICD-10-CM

## 2013-09-23 DIAGNOSIS — Z0000 Encounter for general adult medical examination without abnormal findings: Secondary | ICD-10-CM

## 2013-09-23 MED ORDER — NORETHINDRONE ACET-ETHINYL EST 1.5-30 MG-MCG PO TABS: 1.0000 | ORAL_TABLET | Freq: Every day | ORAL | Status: DC

## 2013-09-23 NOTE — Progress Notes (Signed)
27 y.o. G0P0000 SingleCaucasianF here for annual exam.  On OCPs.  Not sexually active.  Sees Dr. Nyra Jabs, rheumatologist, every 5 months.  Labs every two months.   Patient's last menstrual period was 09/14/2013.          Sexually active: no  The current method of family planning is OCP (estrogen/progesterone).    Exercising: yes  Gym/ health club routine includes cardio, treadmill and running. Smoker:  no  Health Maintenance: Pap: 08/23/12 neg History of abnormal Pap:  no MMG: never Colonoscopy:  11/2008 normal,  Dx with Crohn's Disease 12/2007 BMD:   never TDaP:  Not sure Screening Labs: with rheumatologist, Hb today: 13.4, Urine today: could not void  reports that she has never smoked. She has never used smokeless tobacco. She reports that she drinks about 0.5 ounces of alcohol per week. She reports that she does not use illicit drugs.  Past Medical History  Diagnosis Date  . Crohn's disease   . Inflammatory arthritis   . Anemia     Past Surgical History  Procedure Laterality Date  . Bowel resection      resection of small intestine/ileum removed    Current Outpatient Prescriptions  Medication Sig Dispense Refill  . FOLIC ACID PO Take by mouth daily.      . Ginger, Zingiber officinalis, (GINGER PO) Take by mouth as needed.       . Norethindrone Acetate-Ethinyl Estradiol (JUNEL,LOESTRIN,MICROGESTIN) 1.5-30 MG-MCG tablet Take 1 tablet by mouth daily.  3 Package  1  . Omega-3 Fatty Acids (FISH OIL PO) Take by mouth daily.        No current facility-administered medications for this visit.    Family History  Problem Relation Age of Onset  . Diabetes Maternal Uncle   . Cancer Maternal Grandfather     unknown type  . Cancer Paternal Grandfather     unknown type  . Cancer Paternal Grandmother     unknown type  . Hypertension Father   . Heart attack Maternal Grandfather     ROS:  Pertinent items are noted in HPI.  Otherwise, a comprehensive ROS was negative.  Exam:    BP 122/70  Pulse 84  Resp 18  Ht 5\' 5"  (1.651 m)  Wt 151 lb (68.493 kg)  BMI 25.13 kg/m2  LMP 09/14/2013  Weight change: +6lbs   Height: 5\' 5"  (165.1 cm)  Ht Readings from Last 3 Encounters:  09/23/13 5\' 5"  (1.651 m)    General appearance: alert, cooperative and appears stated age Head: Normocephalic, without obvious abnormality, atraumatic Neck: no adenopathy, supple, symmetrical, trachea midline and thyroid normal to inspection and palpation Lungs: clear to auscultation bilaterally Breasts: normal appearance, no masses or tenderness Heart: regular rate and rhythm Abdomen: soft, non-tender; bowel sounds normal; no masses,  no organomegaly Extremities: extremities normal, atraumatic, no cyanosis or edema Skin: Skin color, texture, turgor normal. No rashes or lesions Lymph nodes: Cervical, supraclavicular, and axillary nodes normal. No abnormal inguinal nodes palpated Neurologic: Grossly normal   Pelvic: External genitalia:  no lesions              Urethra:  normal appearing urethra with no masses, tenderness or lesions              Bartholins and Skenes: normal                 Vagina: normal appearing vagina with normal color and discharge, no lesions  Cervix: no lesions              Pap taken: no Bimanual Exam:  Uterus:  normal size, contour, position, consistency, mobility, non-tender              Adnexa: normal adnexa and no mass, fullness, tenderness               Rectovaginal: Confirms               Anus:  normal sphincter tone, no lesions  A:  Well Woman with normal exam On OCPs, not sexually active Crohn's disease, in remission Inflammatory arthritis  P:   Mammogram starting age 19. pap smear done last year.  No pap today. Junel rx for 90 day supply/71yr refill. Colonoscopy due around 2016.  Dr. Danise Edge at [facility]. return annually or prn  An After Visit Summary was printed and given to the patient.

## 2013-09-23 NOTE — Patient Instructions (Signed)

## 2013-09-27 ENCOUNTER — Encounter: Payer: Self-pay | Admitting: Obstetrics & Gynecology

## 2013-09-28 MED ORDER — NORETHIN ACE-ETH ESTRAD-FE 1.5-30 MG-MCG PO TABS: 1.0000 | ORAL_TABLET | Freq: Every day | ORAL | Status: DC

## 2013-09-28 MED ORDER — NORETHIN ACE-ETH ESTRAD-FE 1-20 MG-MCG PO TABS: 1.0000 | ORAL_TABLET | Freq: Every day | ORAL | Status: DC

## 2013-09-28 NOTE — Telephone Encounter (Signed)
Second Rx done via MyChart was for June 1/20 FE.  I called pharmacy and gave verbal order for Junel 1.5/30FE.  #77months/4RF.  I wanted to make sure RX done correctly this time.  Spoke with pharmacist who will make adjustment.

## 2013-09-28 NOTE — Addendum Note (Signed)
Addended by: Jerene Bears on: 09/28/2013 10:07 AM   Modules accepted: Orders

## 2013-10-13 ENCOUNTER — Other Ambulatory Visit: Payer: Self-pay

## 2014-10-16 ENCOUNTER — Ambulatory Visit (INDEPENDENT_AMBULATORY_CARE_PROVIDER_SITE_OTHER): Payer: BC Managed Care – PPO | Admitting: Obstetrics & Gynecology

## 2014-10-16 ENCOUNTER — Encounter: Payer: Self-pay | Admitting: Obstetrics & Gynecology

## 2014-10-16 VITALS — BP 164/68 | HR 68 | Resp 16 | Ht 65.0 in | Wt 152.2 lb

## 2014-10-16 DIAGNOSIS — Z23 Encounter for immunization: Secondary | ICD-10-CM

## 2014-10-16 DIAGNOSIS — Z01419 Encounter for gynecological examination (general) (routine) without abnormal findings: Secondary | ICD-10-CM

## 2014-10-16 DIAGNOSIS — Z124 Encounter for screening for malignant neoplasm of cervix: Secondary | ICD-10-CM

## 2014-10-16 MED ORDER — NORETHIN ACE-ETH ESTRAD-FE 1.5-30 MG-MCG PO TABS: 1.0000 | ORAL_TABLET | Freq: Every day | ORAL | Status: DC

## 2014-10-16 NOTE — Progress Notes (Signed)
[age] y.o. G0P0000 SingleCaucasianF here for annual exam.  Needs tdap updating.  Cycles are normal and regular.  Flow lasts 2-3 days.  Not SA, never SA.  Patient's last menstrual period was 10/12/2014.          Sexually active: No.  The current method of family planning is OCP (estrogen/progesterone).    Exercising: Yes.    cardio and strength training Smoker:  no  Health Maintenance: Pap:  08/23/12 WNL History of abnormal Pap:  no MMG:  none Colonoscopy:  12/09, h/o Crohn's, Dr. Laural BenesJohnson BMD:   none TDaP:  unsure Screening Labs: n/a, Hb today: n/a, Urine today: n/a   reports that she has never smoked. She has never used smokeless tobacco. She reports that she drinks about 0.5 - 1.0 oz of alcohol per week. She reports that she does not use illicit drugs.  Past Medical History  Diagnosis Date  . Crohn's disease   . Inflammatory arthritis   . Anemia     Past Surgical History  Procedure Laterality Date  . Bowel resection  3/09    resection of small intestine/ileum removed    Current Outpatient Prescriptions  Medication Sig Dispense Refill  . Ginger, Zingiber officinalis, (GINGER PO) Take by mouth as needed.     . norethindrone-ethinyl estradiol-iron (MICROGESTIN FE,GILDESS FE,LOESTRIN FE) 1.5-30 MG-MCG tablet Take 1 tablet by mouth daily. 3 Package 4  . Omega-3 Fatty Acids (FISH OIL PO) Take by mouth daily.      No current facility-administered medications for this visit.    Family History  Problem Relation Age of Onset  . Diabetes Maternal Uncle   . Cancer Maternal Grandfather     unknown type  . Cancer Paternal Grandfather     unknown type  . Cancer Paternal Grandmother     unknown type  . Hypertension Father   . Heart attack Maternal Grandfather     ROS:  Pertinent items are noted in HPI.  Otherwise, a comprehensive ROS was negative.  Exam:   BP 164/68 mmHg  Pulse 68  Resp 16  Ht 5\' 5"  (1.651 m)  Wt 152 lb 3.2 oz (69.037 kg)  BMI 25.33 kg/m2  LMP 10/12/2014   Weight change: +1#   Height: 5\' 5"  (165.1 cm)  Ht Readings from Last 3 Encounters:  10/16/14 5\' 5"  (1.651 m)  09/23/13 5\' 5"  (1.651 m)    General appearance: alert, cooperative and appears stated age Head: Normocephalic, without obvious abnormality, atraumatic Neck: no adenopathy, supple, symmetrical, trachea midline and thyroid normal to inspection and palpation Lungs: clear to auscultation bilaterally Breasts: normal appearance, no masses or tenderness Heart: regular rate and rhythm Abdomen: soft, non-tender; bowel sounds normal; no masses,  no organomegaly Extremities: extremities normal, atraumatic, no cyanosis or edema Skin: Skin color, texture, turgor normal. No rashes or lesions Lymph nodes: Cervical, supraclavicular, and axillary nodes normal. No abnormal inguinal nodes palpated Neurologic: Grossly normal   Pelvic: External genitalia:  no lesions              Urethra:  normal appearing urethra with no masses, tenderness or lesions              Bartholins and Skenes: normal                 Vagina: normal appearing vagina with normal color and discharge, no lesions              Cervix: no lesions  Pap taken: Yes.   Bimanual Exam:  Uterus:  normal size, contour, position, consistency, mobility, non-tender              Adnexa: normal adnexa and no mass, fullness, tenderness               Rectovaginal: Confirms               Anus:  normal sphincter tone, no lesions  A:  Well Woman with normal exam On OCPs, not sexually active Crohn's disease, in remission Inflammatory arthritis  P: Mammogram starting age 28. Tdap today RF for Microgestin Fe 90 day/4 RF.   Pap today. [facility] due 2016 [facility]. return annually or prn  An After Visit Summary was printed and given to the patient.

## 2014-10-18 LAB — IPS PAP TEST WITH REFLEX TO HPV

## 2014-11-08 ENCOUNTER — Encounter: Payer: Self-pay | Admitting: Obstetrics & Gynecology

## 2014-11-08 ENCOUNTER — Other Ambulatory Visit: Payer: Self-pay | Admitting: Emergency Medicine

## 2014-11-08 MED ORDER — NORETHIN ACE-ETH ESTRAD-FE 1.5-30 MG-MCG PO TABS: 1.0000 | ORAL_TABLET | Freq: Every day | ORAL | Status: DC

## 2014-11-08 NOTE — Progress Notes (Signed)
Patient sent mychart message, refill of ocp not on pharmacy. New rx sent electronically. Patient notified via mychart that new rx was sent.  Routing to provider for final review. Patient agreeable to disposition. Will close encounter

## 2015-11-22 ENCOUNTER — Ambulatory Visit (INDEPENDENT_AMBULATORY_CARE_PROVIDER_SITE_OTHER): Payer: BLUE CROSS/BLUE SHIELD | Admitting: Obstetrics & Gynecology

## 2015-11-22 ENCOUNTER — Encounter: Payer: Self-pay | Admitting: Obstetrics & Gynecology

## 2015-11-22 VITALS — BP 142/80 | HR 90 | Resp 16 | Ht 64.5 in | Wt 147.0 lb

## 2015-11-22 DIAGNOSIS — Z0000 Encounter for general adult medical examination without abnormal findings: Secondary | ICD-10-CM

## 2015-11-22 DIAGNOSIS — Z01419 Encounter for gynecological examination (general) (routine) without abnormal findings: Secondary | ICD-10-CM

## 2015-11-22 MED ORDER — NORETHIN ACE-ETH ESTRAD-FE 1.5-30 MG-MCG PO TABS: 1.0000 | ORAL_TABLET | Freq: Every day | ORAL | Status: DC

## 2015-11-22 NOTE — Addendum Note (Signed)
Addended by: Jerene BearsMILLER, Jamaiya Tunnell S on: 11/22/2015 11:48 AM   Modules accepted: Kipp BroodSmartSet

## 2015-11-22 NOTE — Progress Notes (Signed)
29 y.o. G0P0000 SingleCaucasianF here for annual exam.  Doing well.  Still seeing Dr. Corliss Skains but now only yearly.  Has appt in March.  Cycles are regular.  Not SA.    Works at [employer] in [place].  It's about a 20 minute drive to work.  Did complete Gardisil vaccine series.  Patient's last menstrual period was 11/13/2015 (approximate).          Sexually active: No.  The current method of family planning is OCP (estrogen/progesterone) and abstinence.    Exercising: Yes.    Running, weight lifting  Smoker:  no  Health Maintenance: Pap:  10/16/14 Neg  History of abnormal Pap:  no MMG:  Never TDaP:  2015 Screening Labs: Here today, Hb today: 13.5, Urine today: Unable to void   reports that she has never smoked. She has never used smokeless tobacco. She reports that she drinks about 0.5 - 1.0 oz of alcohol per week. She reports that she does not use illicit drugs.  Past Medical History  Diagnosis Date  . Crohn's disease (HCC)   . Inflammatory arthritis (HCC)   . Anemia     Past Surgical History  Procedure Laterality Date  . Bowel resection  3/09    resection of small intestine/ileum removed    Current Outpatient Prescriptions  Medication Sig Dispense Refill  . Ginger, Zingiber officinalis, (GINGER PO) Take by mouth as needed.     . norethindrone-ethinyl estradiol-iron (MICROGESTIN FE,GILDESS FE,LOESTRIN FE) 1.5-30 MG-MCG tablet Take 1 tablet by mouth daily. 3 Package 4  . Omega-3 Fatty Acids (FISH OIL PO) Take by mouth daily.      No current facility-administered medications for this visit.    Family History  Problem Relation Age of Onset  . Diabetes Maternal Uncle   . Cancer Maternal Grandfather     unknown type  . Cancer Paternal Grandfather     unknown type  . Cancer Paternal Grandmother     unknown type  . Hypertension Father   . Heart attack Maternal Grandfather     ROS:  Pertinent items are noted in HPI.  Otherwise, a comprehensive ROS was  negative.  Exam:   BP 142/80 mmHg  Pulse 90  Resp 16  Ht 5' 4.5" (1.638 m)  Wt 147 lb (66.679 kg)  BMI 24.85 kg/m2  LMP 11/13/2015 (Approximate)  Weight change: -4#  Height: 5' 4.5" (163.8 cm)  Ht Readings from Last 3 Encounters:  11/22/15 5' 4.5" (1.638 m)  10/16/14  (1.651 m)  09/23/13  (1.651 m)    General appearance: alert, cooperative and appears stated age Head: Normocephalic, without obvious abnormality, atraumatic Neck: no adenopathy, supple, symmetrical, trachea midline and thyroid normal to inspection and palpation Lungs: clear to auscultation bilaterally Breasts: normal appearance, no masses or tenderness Heart: regular rate and rhythm Abdomen: soft, non-tender; bowel sounds normal; no masses,  no organomegaly Extremities: extremities normal, atraumatic, no cyanosis or edema Skin: Skin color, texture, turgor normal. No rashes or lesions Lymph nodes: Cervical, supraclavicular, and axillary nodes normal. No abnormal inguinal nodes palpated Neurologic: Grossly normal   Pelvic: External genitalia:  no lesions              Urethra:  normal appearing urethra with no masses, tenderness or lesions              Bartholins and Skenes: normal                 Vagina: normal appearing vagina  with normal color and discharge, no lesions              Cervix: no lesions              Pap taken: No. Bimanual Exam:  Uterus:  normal size, contour, position, consistency, mobility, non-tender              Adnexa: normal adnexa and no mass, fullness, tenderness               Rectovaginal: Confirms               Anus:  normal sphincter tone, no lesions  Chaperone was present for exam.  A:  Well Woman with normal exam On OCPs, not sexually active Crohn's disease, in remission, diagnosed 12/2007, s/p resection of ilium 3/09.  Colonoscopy 12/09. Inflammatory arthritis  P: Mammogram starting age 29. pap smear 2015 negative.  No pap today. Junel rx for 90 day  supply/4317yr refill. Colonoscopy follow up due around this year.  Pt in remission and off medication 2014.  Will check with Dr. Laural BenesJohnson regarding this. return annually or prn

## 2015-11-27 LAB — HEMOGLOBIN, FINGERSTICK: Hemoglobin, fingerstick: 13.5 g/dL (ref 12.0–16.0)

## 2016-01-29 ENCOUNTER — Other Ambulatory Visit: Payer: Self-pay | Admitting: Obstetrics & Gynecology

## 2016-01-29 NOTE — Telephone Encounter (Signed)
Medication refill request: Junel  Last AEX:  11/22/15 MSM Next AEX: 03/20/17 MSM Last MMG (if hormonal medication request): NA Refill authorized: 11/22/15 #3 Package 4 Refills   Today: Refused: Refill request too soon

## 2016-12-23 ENCOUNTER — Encounter: Payer: Self-pay | Admitting: Obstetrics & Gynecology

## 2016-12-23 ENCOUNTER — Ambulatory Visit (INDEPENDENT_AMBULATORY_CARE_PROVIDER_SITE_OTHER): Payer: BLUE CROSS/BLUE SHIELD | Admitting: Obstetrics & Gynecology

## 2016-12-23 VITALS — BP 146/94 | HR 112 | Resp 18 | Ht 64.75 in | Wt 148.6 lb

## 2016-12-23 DIAGNOSIS — Z124 Encounter for screening for malignant neoplasm of cervix: Secondary | ICD-10-CM

## 2016-12-23 DIAGNOSIS — Z01419 Encounter for gynecological examination (general) (routine) without abnormal findings: Secondary | ICD-10-CM

## 2016-12-23 MED ORDER — NORETHIN ACE-ETH ESTRAD-FE 1.5-30 MG-MCG PO TABS: 1.0000 | ORAL_TABLET | Freq: Every day | ORAL | 4 refills | Status: DC

## 2016-12-23 NOTE — Progress Notes (Signed)
31 y.o. G0P0000 SingleCaucasianF here for annual exam.  Doing well.  Cycles are regular and lasts about three days.     Patient's last menstrual period was 12/04/2016.          Sexually active: No.  The current method of family planning is OCP (estrogen/progesterone).    Exercising: Yes.    running & strength training Smoker:  no  Health Maintenance: Pap:  10/16/14 negative  History of abnormal Pap:  no MMG:  never Colonoscopy:  2009 Dr. Laural Benes at [place]- normal per patient BMD:   never TDaP:  10/16/14   Pneumonia vaccine(s):  never Zostavax:   never Hep C testing: not indicated  Screening Labs: drawn today, Hb today: same, Urine today: unable to void at this time   reports that she has never smoked. She has never used smokeless tobacco. She reports that she drinks about 0.5 - 1.0 oz of alcohol per week . She reports that she does not use drugs.  Past Medical History:  Diagnosis Date  . Anemia   . Crohn's disease (HCC)   . Inflammatory arthritis     Past Surgical History:  Procedure Laterality Date  . BOWEL RESECTION  3/09   resection of small intestine/ileum removed    Current Outpatient Prescriptions  Medication Sig Dispense Refill  . Ginger, Zingiber officinalis, (GINGER PO) Take by mouth as needed.     . norethindrone-ethinyl estradiol-iron (MICROGESTIN FE,GILDESS FE,LOESTRIN FE) 1.5-30 MG-MCG tablet Take 1 tablet by mouth daily. 3 Package 4  . Omega-3 Fatty Acids (FISH OIL PO) Take by mouth daily.      No current facility-administered medications for this visit.     Family History  Problem Relation Age of Onset  . Diabetes Maternal Uncle   . Cancer Maternal Grandfather     unknown type  . Heart attack Maternal Grandfather   . Cancer Paternal Grandfather     unknown type  . Cancer Paternal Grandmother     unknown type  . Hypertension Father     ROS:  Pertinent items are noted in HPI.  Otherwise, a comprehensive ROS was negative.  Exam:   BP (!) 146/94 (BP  Location: Right Arm, Patient Position: Sitting, Cuff Size: Normal)   Pulse (!) 112   Resp 18   Ht 5' 4.75" (1.645 m)   Wt 148 lb 9.6 oz (67.4 kg)   LMP 12/04/2016   BMI 24.92 kg/m   Weight change: +1#   Height: 5' 4.75" (164.5 cm)  Ht Readings from Last 3 Encounters:  12/23/16 5' 4.75" (1.645 m)  11/22/15 5' 4.5" (1.638 m)  10/16/14 5\' 5"  (1.651 m)    General appearance: alert, cooperative and appears stated age Head: Normocephalic, without obvious abnormality, atraumatic Neck: no adenopathy, supple, symmetrical, trachea midline and thyroid normal to inspection and palpation Lungs: clear to auscultation bilaterally Breasts: normal appearance, no masses or tenderness Heart: regular rate and rhythm Abdomen: soft, non-tender; bowel sounds normal; no masses,  no organomegaly Extremities: extremities normal, atraumatic, no cyanosis or edema Skin: Skin color, texture, turgor normal. No rashes or lesions Lymph nodes: Cervical, supraclavicular, and axillary nodes normal. No abnormal inguinal nodes palpated Neurologic: Grossly normal  Pelvic: External genitalia:  no lesions              Urethra:  normal appearing urethra with no masses, tenderness or lesions              Bartholins and Skenes: normal  Vagina: normal appearing vagina with normal color and discharge, no lesions              Cervix: no lesions              Pap taken: Yes.   Bimanual Exam:  Uterus:  normal size, contour, position, consistency, mobility, non-tender              Adnexa: normal adnexa and no mass, fullness, tenderness               Rectovaginal: Confirms               Anus:  normal sphincter tone, no lesions  Chaperone was present for exam.  A:    Well Woman with normal exam On OCPs, not sexually active Crohn's disease, in remission, diagnosed 12/2007, s/p resection of ilium 3/09.  Colonoscopy 12/09. H/O inflammatory arthritis Elevated BP, possible white coat hypertension  P:  Mammogram starting age 31. Pap obtained today. Junel rx for 90 day supply/1622yr refill.  Pt will check BPs over the next couple of weeks and she will send me these through my chart.   Will check with her GI regarding next colonoscopy. Return annually or prn

## 2016-12-24 LAB — IPS PAP TEST WITH REFLEX TO HPV

## 2016-12-29 ENCOUNTER — Other Ambulatory Visit: Payer: Self-pay | Admitting: Obstetrics & Gynecology

## 2016-12-29 ENCOUNTER — Telehealth: Payer: Self-pay | Admitting: *Deleted

## 2016-12-29 DIAGNOSIS — K5000 Crohn's disease of small intestine without complications: Secondary | ICD-10-CM

## 2016-12-29 NOTE — Telephone Encounter (Signed)
Order placed for GI referral.  Ok to close encounter.

## 2016-12-29 NOTE — Telephone Encounter (Signed)
Patient returned call. Patient given message as seen below from Dr. Hyacinth MeekerMiller. Patient states she does not have a preference on referral. Patient states she has only seen Dr. Laural BenesJohnson, but would be okay seeing anyone. RN advised this message would be sent to Dr. Hyacinth MeekerMiller for review and to place referral to GI. Patient aware referral coordinator will be in touch once referral is placed and processed.   Routing to provider for review.   Cc. Lilyan GilfordBecky Frahm

## 2016-12-29 NOTE — Telephone Encounter (Signed)
Message noted from Dr. Miller. Encounter closed. 

## 2016-12-29 NOTE — Telephone Encounter (Signed)
-----   Message from Jerene BearsMary S Miller, MD sent at 12/26/2016 10:59 AM EST ----- Regarding: RE: colonoscopy follow up  Please let pt know the [facility].  I think she needs a phone note about this.  Also spoke with another GI who could not say if pt needed another colonoscopy at this time or not but recommended she be seen for evaluation.  Pt needs GI referral.  Where would she like me to do the referral?  Thanks.  Rosalita ChessmanSuzanne  ----- Message ----- From: Jetta LoutEmily T Jett Kulzer, RN Sent: 12/23/2016   1:37 PM To: Jerene BearsMary S Miller, MD Subject: RE: colonoscopy follow up                      Dr. Hyacinth MeekerMiller,  I called and [facility] with [facility]reesha at [facility] who was unable to see anything on Radha's chart, but that her last colonoscopy was 11/28/08. [facility] where Dr. Laural BenesJohnson is located. I spoke with Steward DroneBrenda there, and she states the patient is "inactive" with their office and was last seen in 2011. She said she could see no follow-up appointments for the patient, or when colonoscopy should be repeated.   Thanks,  Irving BurtonEmily  ----- Message ----- From: Jerene BearsMary S Miller, MD Sent: 12/23/2016  11:42 AM To: Jetta LoutEmily T Tirso Laws, RN Subject: colonoscopy follow up                          Can you call [facility] and see when this pt's colononscoyp is due.  She has hx of Crohn's disease.  She is off all immunosuppressants and has been for about three years.    Thanks.  Rosalita ChessmanSuzanne

## 2016-12-29 NOTE — Telephone Encounter (Signed)
Message left to return call to Juandiego Kolenovic at [phone].    

## 2016-12-31 ENCOUNTER — Other Ambulatory Visit: Payer: Self-pay | Admitting: Obstetrics & Gynecology

## 2017-02-02 ENCOUNTER — Telehealth: Payer: Self-pay | Admitting: *Deleted

## 2017-02-02 NOTE — Telephone Encounter (Signed)
-----   Message from Jerene BearsMary S Miller, MD sent at 02/01/2017 12:41 AM EST ----- Regarding: please call Irving Burtonmily, Can you call and check on this pt as well to see if she's checked any BPs?  Her BP was mildly elevated when she came for her AEX as well.  Also, does she have the GI appt?  Just wondering.   Thanks.  MSM

## 2017-02-02 NOTE — Telephone Encounter (Signed)
Patient returning your call.

## 2017-02-02 NOTE — Telephone Encounter (Signed)
Returned call to patient. Patient states that she has a friend who has a blood pressure cuff and took her blood pressure for her 3 times. On 2/5 it was 113/70, 2/7 was 126/80, and 2/10 it was 117/75. As RN was speaking to patient, referral coordinator, Kriste BasqueBecky, left voicemail for patient letting her know that her appointment with Dr. Loreta AveMann is scheduled for next Thursday 02/12/17.    Routing to provider for final review. Patient agreeable to disposition. Will close encounter.

## 2017-02-02 NOTE — Telephone Encounter (Signed)
Call to patient to notify GI referral appointment. Left all details on voicemail. Listed below. Notified to call Dr Kenna GilbertMann's office to cancel/reschedule appointment.  [facility] [address] [place]  Phone: [phone]  Dr Loreta AveMann 02-12-17 @ 9:15am. Please arrive 15 minutes early and bring your insurance card, photo id and list of medications.

## 2017-02-02 NOTE — Telephone Encounter (Signed)
Message left to return call to Angelly Spearing at [phone].    

## 2017-02-18 ENCOUNTER — Other Ambulatory Visit: Payer: Self-pay | Admitting: Gastroenterology

## 2017-02-18 DIAGNOSIS — K5000 Crohn's disease of small intestine without complications: Secondary | ICD-10-CM

## 2017-02-26 ENCOUNTER — Ambulatory Visit
Admission: RE | Admit: 2017-02-26 | Discharge: 2017-02-26 | Disposition: A | Discharge status: Home / Self Care | Payer: Self-pay | Source: Ambulatory Visit | Attending: Gastroenterology | Admitting: Gastroenterology

## 2017-02-26 DIAGNOSIS — K5000 Crohn's disease of small intestine without complications: Secondary | ICD-10-CM

## 2017-02-26 MED ORDER — IOPAMIDOL (ISOVUE-300) INJECTION 61%
100.0000 mL | Freq: Once | INTRAVENOUS | Status: AC | PRN
  Administered 2017-02-26: 100 mL via INTRAVENOUS

## 2017-03-20 ENCOUNTER — Ambulatory Visit: Payer: BLUE CROSS/BLUE SHIELD | Admitting: Obstetrics & Gynecology

## 2017-12-28 ENCOUNTER — Ambulatory Visit: Payer: BLUE CROSS/BLUE SHIELD | Admitting: Obstetrics & Gynecology

## 2017-12-28 ENCOUNTER — Encounter: Payer: Self-pay | Admitting: Obstetrics & Gynecology

## 2017-12-28 ENCOUNTER — Other Ambulatory Visit (HOSPITAL_COMMUNITY)
Admission: RE | Admit: 2017-12-28 | Discharge: 2017-12-28 | Disposition: A | Discharge status: Home / Self Care | Payer: BLUE CROSS/BLUE SHIELD | Source: Ambulatory Visit | Attending: Obstetrics & Gynecology | Admitting: Obstetrics & Gynecology

## 2017-12-28 ENCOUNTER — Other Ambulatory Visit: Payer: Self-pay

## 2017-12-28 VITALS — BP 162/84 | HR 110 | Resp 16 | Ht 65.0 in | Wt 153.0 lb

## 2017-12-28 DIAGNOSIS — Z01419 Encounter for gynecological examination (general) (routine) without abnormal findings: Secondary | ICD-10-CM | POA: Diagnosis not present

## 2017-12-28 DIAGNOSIS — Z124 Encounter for screening for malignant neoplasm of cervix: Secondary | ICD-10-CM | POA: Diagnosis not present

## 2017-12-28 MED ORDER — NORETHINDRONE 0.35 MG PO TABS: 1.0000 | ORAL_TABLET | Freq: Every day | ORAL | 4 refills | Status: DC

## 2017-12-28 NOTE — Progress Notes (Signed)
Patient scheduled while in office for 2 month recheck with Dr. Hyacinth MeekerMiller on 03/02/18 at 2:30pm. Patient is agreeable to date and time.

## 2017-12-28 NOTE — Progress Notes (Signed)
32 y.o. G0P0000 SingleCaucasianF here for annual exam.  Doing well.  Denies headache.  Aware BP elevated.     Patient's last menstrual period was 12/01/2017.          Sexually active: No.  The current method of family planning is OCP (estrogen/progesterone).    Exercising: Yes.    cardio Smoker:  no  Health Maintenance: Pap:  12/23/16 Neg.   10/16/14 Neg.  History of abnormal Pap:  no MMG: Never Colonoscopy: 11/2008 Crohn's disease.  02/18/17 Dr. Loreta Ave.  Biopsies showed ileitis  TDaP:  2015 Screening Labs: if needed   reports that  has never smoked. she has never used smokeless tobacco. She reports that she drinks about 0.5 - 1.0 oz of alcohol per week. She reports that she does not use drugs.  Past Medical History:  Diagnosis Date  . Anemia   . Crohn's disease (HCC)   . Inflammatory arthritis     Past Surgical History:  Procedure Laterality Date  . BOWEL RESECTION  3/09   resection of small intestine/ileum removed    Current Outpatient Medications  Medication Sig Dispense Refill  . azaTHIOprine (IMURAN) 50 MG tablet Take 1 tablet by mouth daily.    . Ginger, Zingiber officinalis, (GINGER PO) Take by mouth as needed.     . norethindrone-ethinyl estradiol-iron (MICROGESTIN FE,GILDESS FE,LOESTRIN FE) 1.5-30 MG-MCG tablet Take 1 tablet by mouth daily. 3 Package 4  . Omega-3 Fatty Acids (FISH OIL PO) Take by mouth daily.      No current facility-administered medications for this visit.     Family History  Problem Relation Age of Onset  . Diabetes Maternal Uncle   . Cancer Maternal Grandfather        unknown type  . Heart attack Maternal Grandfather   . Cancer Paternal Grandfather        unknown type  . Cancer Paternal Grandmother        unknown type  . Hypertension Father     ROS:  Pertinent items are noted in HPI.  Otherwise, a comprehensive ROS was negative.  Exam:   BP (!) 162/84 (BP Location: Left Arm, Patient Position: Sitting, Cuff Size: Normal)   Pulse (!)  110   Resp 16   Ht 5\' 5"  (1.651 m)   Wt 153 lb (69.4 kg)   LMP 12/01/2017   BMI 25.46 kg/m   Weight change: +5#   Height: 5\' 5"  (165.1 cm)  Ht Readings from Last 3 Encounters:  12/28/17 5\' 5"  (1.651 m)  12/23/16 5' 4.75" (1.645 m)  11/22/15 5' 4.5" (1.638 m)    General appearance: alert, cooperative and appears stated age Head: Normocephalic, without obvious abnormality, atraumatic Neck: no adenopathy, supple, symmetrical, trachea midline and thyroid normal to inspection and palpation Lungs: clear to auscultation bilaterally Breasts: normal appearance, no masses or tenderness Heart: regular rate and rhythm Abdomen: soft, non-tender; bowel sounds normal; no masses,  no organomegaly Extremities: extremities normal, atraumatic, no cyanosis or edema Skin: Skin color, texture, turgor normal. No rashes or lesions Lymph nodes: Cervical, supraclavicular, and axillary nodes normal. No abnormal inguinal nodes palpated Neurologic: Grossly normal   Pelvic: External genitalia:  no lesions              Urethra:  normal appearing urethra with no masses, tenderness or lesions              Bartholins and Skenes: normal  Vagina: normal appearing vagina with normal color and discharge, no lesions              Cervix: no lesions              Pap taken: Yes.   Bimanual Exam:  Uterus:  normal size, contour, position, consistency, mobility, non-tender              Adnexa: normal adnexa and no mass, fullness, tenderness               Rectovaginal: Confirms               Anus:  normal sphincter tone, no lesions  Chaperone was present for exam.  A:  Well Woman with normal exam Elevated BPs on OCPs (possibly white coat hypertension) H/O Crohn's disease.  Followed by Dr. Loreta AveMann.  Last colonoscopy 3/18 with active disease H/o inflammatory arthritis  P:   Mammogram guidelines reviewe pap smear obtained today.  Have discussed with pt yearly screening due to immunosuppression Change OCPs  to micronor.  90 day supply given.  Recheck BP 2 months Has follow up with Dr. Loreta AveMann next month Return annually or prn

## 2017-12-30 LAB — CYTOLOGY - PAP: DIAGNOSIS: NEGATIVE

## 2018-02-17 ENCOUNTER — Other Ambulatory Visit: Payer: Self-pay | Admitting: Obstetrics & Gynecology

## 2018-02-23 ENCOUNTER — Other Ambulatory Visit: Payer: Self-pay

## 2018-02-23 ENCOUNTER — Telehealth: Payer: Self-pay | Admitting: *Deleted

## 2018-02-23 ENCOUNTER — Telehealth: Payer: Self-pay | Admitting: Obstetrics & Gynecology

## 2018-02-23 ENCOUNTER — Ambulatory Visit (INDEPENDENT_AMBULATORY_CARE_PROVIDER_SITE_OTHER): Payer: BLUE CROSS/BLUE SHIELD | Admitting: *Deleted

## 2018-02-23 VITALS — BP 148/90 | HR 92 | Wt 150.2 lb

## 2018-02-23 DIAGNOSIS — Z0130 Encounter for examination of blood pressure without abnormal findings: Secondary | ICD-10-CM

## 2018-02-23 NOTE — Telephone Encounter (Signed)
Patient returned call. Patient states she does not have a PCP, but has been thinking about establishing with one, so she will start researching providers. Aware to keep follow up as scheduled with Dr. Hyacinth MeekerMiller on Tuesday 03/02/18.

## 2018-02-23 NOTE — Telephone Encounter (Signed)
Detailed message left per DPR on mobile number that RN reviewed blood pressure with Dr. Oscar LaJertson and patient should follow up with PCP about blood pressure since patient on progesterone only pill, micronor. Advised patient to return call if any additional questions.

## 2018-02-23 NOTE — Progress Notes (Signed)
Patient here for blood pressure recheck. Patient appearing anxious and states she gets very anxious at the doctor's office when she sees the blood pressure cuff. Patient's initial blood pressure was 182/90, patient sat for 10 minutes and rechecked and blood pressure down to 148/90. [facility] 117/78. Patient states no headaches associated with high blood pressure. Patient has follow up scheduled with Dr. Hyacinth MeekerMiller on 03/02/18.  Routing to provider for final review. Patient agreeable to disposition. Will close encounter.

## 2018-02-25 ENCOUNTER — Telehealth: Payer: Self-pay | Admitting: *Deleted

## 2018-02-25 NOTE — Telephone Encounter (Addendum)
[facility] at [phone].   Need to advise patient per Dr. Hyacinth MeekerMiller move appointment out one month and keep a log of blood pressures for one month to bring to appointment.

## 2018-02-25 NOTE — Telephone Encounter (Deleted)
-----   Message from Jerene BearsMary S Miller, MD sent at 02/25/2018  8:06 AM EDT -----   ----- Message ----- From: Jetta Loutaldwell, Emily T, RN Sent: 02/23/2018   2:28 PM To: Jerene BearsMary S Miller, MD

## 2018-02-25 NOTE — Telephone Encounter (Signed)
Patient returned call. Advised patient after review with Dr. Hyacinth MeekerMiller, patient should keep a log of her blood pressure [facility], and will move her follow up appointment out one month. OV changed to Tuesday 03/23/18 at 1430. Patient agreeable to date and time of appointment. Patient will keep log of BP's and bring to appointment in April. Patient states [facility] and it was 118/80.   Routing to provider for final review. Patient agreeable to disposition. Will close encounter.

## 2018-02-25 NOTE — Telephone Encounter (Signed)
Author: Jerene BearsMiller, Mary S, MD Service: Gynecology Author Type: Physician Filed: 02/25/2018 8:06 AM Encounter Date: 02/23/2018 Status: Signed Editor: Jerene BearsMiller, Mary S, MD (Physician)   [facility] for the next month and send me the results.  Can you keep this is your inbox to follow up with her in a month?  Thanks.

## 2018-02-25 NOTE — Progress Notes (Signed)
[facility] the next month and send me the results.  Can you keep this is your inbox to follow up with her in a month?  Thanks.

## 2018-03-02 ENCOUNTER — Ambulatory Visit: Payer: Self-pay | Admitting: Obstetrics & Gynecology

## 2018-03-02 NOTE — Progress Notes (Signed)
Patient notified. See telephone encounter date 02/25/18.

## 2018-03-23 ENCOUNTER — Ambulatory Visit (INDEPENDENT_AMBULATORY_CARE_PROVIDER_SITE_OTHER): Payer: BLUE CROSS/BLUE SHIELD | Admitting: Obstetrics & Gynecology

## 2018-03-23 ENCOUNTER — Encounter: Payer: Self-pay | Admitting: Obstetrics & Gynecology

## 2018-03-23 VITALS — BP 140/88 | HR 96 | Ht 65.0 in | Wt 147.0 lb

## 2018-03-23 DIAGNOSIS — R030 Elevated blood-pressure reading, without diagnosis of hypertension: Secondary | ICD-10-CM | POA: Insufficient documentation

## 2018-03-23 NOTE — Progress Notes (Signed)
GYNECOLOGY  VISIT  CC:   Recheck after changing OCPs and recheck BP  HPI: 32 y.o. G0P0000 Single Caucasian female here for OCP follow up.  Blood pressure is often elevated when she is here but this last AEX in January, BP was 162/84.  Pt really felt it was related to being in the office but was willing to change OCPs and return for repeat BP.  Was changed from combination OCP to Micronor.  Reports she's really felt no difference with change in OCP.  The first month, she didn't have a cycle at all.  The second month, the cycle lasted about 4 days but most of these were light.  Denies any other side effects.  Denies mood changes.    I also asked her to check her BP and she took BP 21 times over the last month or so.  BP ranges are all 102-118/58-72 (with one 80 outlier which was the first day she took her BP).  So, I do think she has white coat hypertension as BP is 140/88 today but I am not sure whether the combination OCPs were contributing.  As she is doing well with current OCP, will not make any changes.    GYNECOLOGIC HISTORY: Patient's last menstrual period was 03/07/2018 (approximate). Contraception: micronor Menopausal hormone therapy: none  Patient Active Problem List   Diagnosis Date Noted  . Crohn's disease (HCC) 09/23/2013  . Inflammatory arthritis 09/23/2013    Past Medical History:  Diagnosis Date  . Anemia   . Crohn's disease (HCC)   . Inflammatory arthritis     Past Surgical History:  Procedure Laterality Date  . BOWEL RESECTION  3/09   resection of small intestine/ileum removed    MEDS:   Current Outpatient Medications on File Prior to Visit  Medication Sig Dispense Refill  . azaTHIOprine (IMURAN) 50 MG tablet Take 1 tablet by mouth daily.    . Ginger, Zingiber officinalis, (GINGER PO) Take by mouth as needed.     . norethindrone (MICRONOR,CAMILA,ERRIN) 0.35 MG tablet Take 1 tablet (0.35 mg total) by mouth daily. 3 Package 4  . Omega-3 Fatty Acids (FISH OIL PO)  Take by mouth daily.      No current facility-administered medications on file prior to visit.     ALLERGIES: Penicillins  Family History  Problem Relation Age of Onset  . Diabetes Maternal Uncle   . Cancer Maternal Grandfather        unknown type  . Heart attack Maternal Grandfather   . Cancer Paternal Grandfather        unknown type  . Cancer Paternal Grandmother        unknown type  . Hypertension Father     SH:  Single, non smoker  Review of Systems  All other systems reviewed and are negative.   PHYSICAL EXAMINATION:    BP 140/88 (BP Location: Left Arm, Patient Position: Sitting, Cuff Size: Normal)   Pulse 96   Ht 5\' 5"  (1.651 m)   Wt 147 lb (66.7 kg)   LMP 03/07/2018 (Approximate)   BMI 24.46 kg/m     General appearance: alert, cooperative and appears stated age No other exam performed today  Assessment: White coat hypertension Possible BP elevated with combination OCP, now on micronor  Plan: Pt does not need RF as was done at last visit.  She will continue to monitor cycles and call with any issues/concerns about cycle changes.  If needs to go back to combination OCPs, would have  her check BPs again over a month after she had been back on the pill for at least one month.  Pt voices understanding of plan and feels comfortable with this.   ~15 minutes spent with patient >50% of time was in face to face discussion of above.

## 2018-04-08 NOTE — Progress Notes (Signed)
Office Visit Note  Patient: Amber Delgado             Date of Birth: Feb 23, 1986           MRN: [id number]             PCP: Patient, No Pcp Per Referring: Charna Elizabeth, MD Visit Date: 04/09/2018 Occupation: @    Subjective:  Right hip pain.   History of Present Illness: Amber Delgado is a 32 y.o. female with history of Crohn's disease and inflammatory arthritis.  According to patient she started having some discomfort in her right hip which moved to her right trochanteric area.  She has been having some discomfort in her right trochanteric which has been persistent.  She also had an episode of left knee joint discomfort which she relates to kneeling on her left knee which is resolved now.  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  She denies any history of joint swelling.  She had colonoscopy and March 2019 and was told that she had mild inflammation.  She is not having any symptoms of Crohn's currently.  Activities of Daily Living:  Patient reports morning stiffness for 0 minute.   Patient Denies nocturnal pain.  Difficulty dressing/grooming: Denies Difficulty climbing stairs: Denies Difficulty getting out of chair: Denies Difficulty using hands for taps, buttons, cutlery, and/or writing: Denies   Review of Systems  Constitutional: Negative for fatigue, night sweats, weight gain and weight loss.  HENT: Negative for mouth sores, trouble swallowing, trouble swallowing, mouth dryness and nose dryness.   Eyes: Negative for pain, redness, visual disturbance and dryness.  Respiratory: Negative for cough, shortness of breath and difficulty breathing.   Cardiovascular: Negative for chest pain, palpitations, hypertension, irregular heartbeat and swelling in legs/feet.  Gastrointestinal: Positive for diarrhea and nausea. Negative for blood in stool and constipation.       1 loose stool a day.  Endocrine: Negative for increased urination.    Genitourinary: Negative for vaginal dryness.  Musculoskeletal: Positive for arthralgias and joint pain. Negative for joint swelling, myalgias, muscle weakness, morning stiffness, muscle tenderness and myalgias.  Skin: Negative for color change, rash, hair loss, skin tightness, ulcers and sensitivity to sunlight.  Allergic/Immunologic: Negative for susceptible to infections.  Neurological: Negative for dizziness, memory loss, night sweats and weakness.  Hematological: Negative for swollen glands.  Psychiatric/Behavioral: Negative for depressed mood and sleep disturbance. The patient is not nervous/anxious.     PMFS History:  Patient Active Problem List   Diagnosis Date Noted  . White coat syndrome without diagnosis of hypertension 03/23/2018  . Crohn's disease (HCC) 09/23/2013  . Inflammatory arthritis 09/23/2013    Past Medical History:  Diagnosis Date  . Anemia   . Crohn's disease (HCC)   . Inflammatory arthritis     Family History  Problem Relation Age of Onset  . Diabetes Maternal Uncle   . Cancer Maternal Grandfather        unknown type  . Heart attack Maternal Grandfather   . Cancer Paternal Grandfather        unknown type  . Cancer Paternal Grandmother        unknown type  . Heart failure Father   . Hypertension Father   . Healthy Sister    Past Surgical History:  Procedure Laterality Date  . BOWEL RESECTION  3/09   resection of small intestine/ileum removed   Social History   Social History Narrative  .  Not on file     Objective: Vital Signs: BP (!) 150/94 (BP Location: Right Arm, Patient Position: Sitting, Cuff Size: Normal)   Pulse (!) 111   Resp 12   Ht  (1.651 m)   Wt 149 lb (67.6 kg)   BMI 24.79 kg/m    Physical Exam  Constitutional: She is oriented to person, place, and time. She appears well-developed and well-nourished.  HENT:  Head: Normocephalic and atraumatic.  Eyes: Conjunctivae and EOM are normal.  Neck: Normal range of motion.   Cardiovascular: Normal rate, regular rhythm, normal heart sounds and intact distal pulses.  Pulmonary/Chest: Effort normal and breath sounds normal.  Abdominal: Soft. Bowel sounds are normal.  Lymphadenopathy:    She has no cervical adenopathy.  Neurological: She is alert and oriented to person, place, and time.  Skin: Skin is warm and dry. Capillary refill takes less than 2 seconds.  Psychiatric: She has a normal mood and affect. Her behavior is normal.  Nursing note and vitals reviewed.    Musculoskeletal Exam: C-spine thoracic lumbar spine good range of motion.  Shoulder joints elbow joints wrist joint MCPs PIPs DIPs were in good range of motion.  Hip joints knee joints ankles MTPs PIPs DIPs with good range of motion.  She has some discomfort range of motion of her right hip joint.  She also had tenderness over right trochanteric bursa.  CDAI Exam: No CDAI exam completed.    Investigation: No additional findings.   Imaging: Xr Hip Unilat W Or W/o Pelvis 2-3 Views Right  Result Date: 04/09/2018 No hip joint narrowing or chondrocalcinosis was noted. Impression: Unremarkable x-ray of the hip joint.   Speciality Comments: No specialty comments available.    Procedures:  Large Joint Inj: R greater trochanter on 04/09/2018 10:30 AM Indications: pain Details: 27 G 1.5 in needle, lateral approach  Arthrogram: No  Medications: 40 mg triamcinolone acetonide 40 MG/ML; 1.5 mL lidocaine 1 % Aspirate: 0 mL Outcome: tolerated well, no immediate complications Procedure, treatment alternatives, risks and benefits explained, specific risks discussed. Consent was given by the patient. Immediately prior to procedure a time out was called to verify the correct patient, procedure, equipment, support staff and site/side marked as required. Patient was prepped and draped in the usual sterile fashion.     Allergies: Penicillins   Assessment / Plan:     Visit Diagnoses: Crohn's disease with  complication, unspecified gastrointestinal tract location (HCC)-patient reports that her Crohn's disease is fairly well controlled.  She had recent colonoscopy which showed only mild inflammation.  She had one loose stool a day.  High risk medication use - Imuran as prescribed by Dr. Loreta Ave.- Plan: CBC with Differential/Platelet, COMPLETE METABOLIC PANEL WITH GFR  Inflammatory arthritis - +ANA.  Patient has no synovitis on examination.  Pain in right hip - Plan: XR HIP UNILAT W OR W/O PELVIS 2-3 VIEWS RIGHT x-ray of the hip joint was unremarkable.  Trochanteric bursitis of right hip-a handout on IT band exercises was given.  Different treatment options were discussed per her request right trochanteric bursa was injected with cortisone as described above.  Nausea - Plan: Amylase, Lipase.  If her symptoms persist have advised her to follow-up with Dr. Loreta Ave.  Her blood pressure was elevated today.  She states her blood pressure stays normal she does have whitecoat syndrome.  She also brought some blood pressure readings which were within normal limits.     Orders: Orders Placed This Encounter  Procedures  .  XR HIP UNILAT W OR W/O PELVIS 2-3 VIEWS RIGHT  . CBC with Differential/Platelet  . COMPLETE METABOLIC PANEL WITH GFR  . Amylase  . Lipase   No orders of the defined types were placed in this encounter.   Face-to-face time spent with patient was 30 minutes.> 50% of time was spent in counseling and coordination of care.  Follow-Up Instructions: Return in about 5 months (around 09/09/2018) for Inflammatory arthritis, Crohn's disease.   Pollyann Savoy, MD  Note - This record has been created using Animal nutritionist.  Chart creation errors have been sought, but may not always  have been located. Such creation errors do not reflect on  the standard of medical care.

## 2018-04-09 ENCOUNTER — Ambulatory Visit: Payer: BLUE CROSS/BLUE SHIELD | Admitting: Rheumatology

## 2018-04-09 ENCOUNTER — Encounter: Payer: Self-pay | Admitting: Rheumatology

## 2018-04-09 ENCOUNTER — Ambulatory Visit (INDEPENDENT_AMBULATORY_CARE_PROVIDER_SITE_OTHER): Payer: Self-pay

## 2018-04-09 VITALS — BP 150/94 | HR 111 | Resp 12 | Ht 65.0 in | Wt 149.0 lb

## 2018-04-09 DIAGNOSIS — K50919 Crohn's disease, unspecified, with unspecified complications: Secondary | ICD-10-CM

## 2018-04-09 DIAGNOSIS — M7061 Trochanteric bursitis, right hip: Secondary | ICD-10-CM | POA: Diagnosis not present

## 2018-04-09 DIAGNOSIS — M1990 Unspecified osteoarthritis, unspecified site: Secondary | ICD-10-CM

## 2018-04-09 DIAGNOSIS — M25551 Pain in right hip: Secondary | ICD-10-CM

## 2018-04-09 DIAGNOSIS — Z79899 Other long term (current) drug therapy: Secondary | ICD-10-CM | POA: Diagnosis not present

## 2018-04-09 DIAGNOSIS — R110 Nausea: Secondary | ICD-10-CM

## 2018-04-09 MED ORDER — TRIAMCINOLONE ACETONIDE 40 MG/ML IJ SUSP
40.0000 mg | INTRAMUSCULAR | Status: AC | PRN
  Administered 2018-04-09: 40 mg via INTRA_ARTICULAR

## 2018-04-09 MED ORDER — LIDOCAINE HCL 1 % IJ SOLN
1.5000 mL | INTRAMUSCULAR | Status: AC | PRN
Start: 2018-04-09 — End: 2018-04-09
  Administered 2018-04-09: 1.5 mL

## 2018-04-09 NOTE — Patient Instructions (Signed)
Iliotibial Band Syndrome Rehab  Ask your health care provider which exercises are safe for you. Do exercises exactly as told by your health care provider and adjust them as directed. It is normal to feel mild stretching, pulling, tightness, or discomfort as you do these exercises, but you should stop right away if you feel sudden pain or your pain gets worse. Do not begin these exercises until told by your health care provider.  Stretching and range of motion exercises  These exercises warm up your muscles and joints and improve the movement and flexibility of your hip and pelvis.  Exercise A: Quadriceps, prone    1. Lie on your abdomen on a firm surface, such as a bed or padded floor.  2. Bend your left / right knee and hold your ankle. If you cannot reach your ankle or pant leg, loop a belt around your foot and grab the belt instead.  3. Gently pull your heel toward your buttocks. Your knee should not slide out to the side. You should feel a stretch in the front of your thigh and knee.  4. Hold this position for __________ seconds.  Repeat __________ times. Complete this stretch __________ times a day.  Exercise B: Iliotibial band    1. Lie on your side with your left / right leg in the top position.  2. Bend both of your knees and grab your left / right ankle. Stretch out your bottom arm to help you balance.  3. Slowly bring your top knee back so your thigh goes behind your trunk.  4. Slowly lower your top leg toward the floor until you feel a gentle stretch on the outside of your left / right hip and thigh. If you do not feel a stretch and your knee will not fall farther, place the heel of your other foot on top of your knee and pull your knee down toward the floor with your foot.  5. Hold this position for __________ seconds.  Repeat __________ times. Complete this stretch __________ times a day.  Strengthening exercises  These exercises build strength and endurance in your hip and pelvis. Endurance is the  ability to use your muscles for a long time, even after they get tired.  Exercise C: Straight leg raises (  hip abductors)  1. Lie on your side with your left / right leg in the top position. Lie so your head, shoulder, knee, and hip line up. You may bend your bottom knee to help you balance.  2. Roll your hips slightly forward so your hips are stacked directly over each other and your left / right knee is facing forward.  3. Tense the muscles in your outer thigh and lift your top leg 4-6 inches (10-15 cm).  4. Hold this position for __________ seconds.  5. Slowly return to the starting position. Let your muscles relax completely before doing another repetition.  Repeat __________ times. Complete this exercise __________ times a day.  Exercise D: Straight leg raises (  hip extensors)  1. Lie on your abdomen on your bed or a firm surface. You can put a pillow under your hips if that is more comfortable.  2. Bend your left / right knee so your foot is straight up in the air.  3. Squeeze your buttock muscles and lift your left / right thigh off the bed. Do not let your back arch.  4. Tense this muscle as hard as you can without increasing any knee pain.    5. Hold this position for __________ seconds.  6. Slowly lower your leg to the starting position and allow it to relax completely.  Repeat __________ times. Complete this exercise __________ times a day.  Exercise E: Hip hike  1. Stand sideways on a bottom step. Stand on your left / right leg with your other foot unsupported next to the step. You can hold onto the railing or wall if needed for balance.  2. Keep your knees straight and your torso square. Then, lift your left / right hip up toward the ceiling.  3. Slowly let your left / right hip lower toward the floor, past the starting position. Your foot should get closer to the floor. Do not lean or bend your knees.  Repeat __________ times. Complete this exercise __________ times a day.  This information is not  intended to replace advice given to you by your health care provider. Make sure you discuss any questions you have with your health care provider.  Document Released: 11/24/2005 Document Revised: 07/29/2016 Document Reviewed: 10/26/2015  [employer] Interactive Patient Education © 2018 [employer]

## 2018-04-10 LAB — COMPLETE METABOLIC PANEL WITH GFR
AG RATIO: 1.8 (calc) (ref 1.0–2.5)
ALBUMIN MSPROF: 4.6 g/dL (ref 3.6–5.1)
ALT: 13 U/L (ref 6–29)
AST: 18 U/L (ref 10–30)
Alkaline phosphatase (APISO): 58 U/L (ref 33–115)
BILIRUBIN TOTAL: 0.9 mg/dL (ref 0.2–1.2)
BUN: 16 mg/dL (ref 7–25)
CHLORIDE: 110 mmol/L (ref 98–110)
CO2: 28 mmol/L (ref 20–32)
Calcium: 9.3 mg/dL (ref 8.6–10.2)
Creat: 0.79 mg/dL (ref 0.50–1.10)
GFR, EST AFRICAN AMERICAN: 117 mL/min/{1.73_m2} (ref >=60)
GFR, Est Non African American: 101 mL/min/{1.73_m2} (ref >=60)
GLUCOSE: 102 mg/dL — AB (ref 65–99)
Globulin: 2.5 g/dL (calc) (ref 1.9–3.7)
Potassium: 4.5 mmol/L (ref 3.5–5.3)
Sodium: 142 mmol/L (ref 135–146)
Total Protein: 7.1 g/dL (ref 6.1–8.1)

## 2018-04-10 LAB — CBC WITH DIFFERENTIAL/PLATELET
BASOS PCT: 0.7 %
Basophils Absolute: 38 cells/uL (ref 0–200)
EOS ABS: 70 {cells}/uL (ref 15–500)
Eosinophils Relative: 1.3 %
HEMATOCRIT: 40.8 % (ref 35.0–45.0)
HEMOGLOBIN: 13.7 g/dL (ref 11.7–15.5)
LYMPHS ABS: 967 {cells}/uL (ref 850–3900)
MCH: 30.6 pg (ref 27.0–33.0)
MCHC: 33.6 g/dL (ref 32.0–36.0)
MCV: 91.3 fL (ref 80.0–100.0)
MPV: 11.8 fL (ref 7.5–12.5)
Monocytes Relative: 8.3 %
NEUTROS ABS: 3877 {cells}/uL (ref 1500–7800)
Neutrophils Relative %: 71.8 %
Platelets: 228 10*3/uL (ref 140–400)
RBC: 4.47 10*6/uL (ref 3.80–5.10)
RDW: 12.2 % (ref 11.0–15.0)
Total Lymphocyte: 17.9 %
WBC: 5.4 10*3/uL (ref 3.8–10.8)
WBCMIX: 448 {cells}/uL (ref 200–950)

## 2018-04-10 LAB — LIPASE: Lipase: 18 U/L (ref 7–60)

## 2018-04-10 LAB — AMYLASE: Amylase: 49 U/L (ref 21–101)

## 2018-08-27 NOTE — Progress Notes (Deleted)
Office Visit Note  Patient: Amber Delgado             Date of Birth: 1986-02-15           MRN: [id number]             PCP: Patient, No Pcp Per Referring: No ref. provider found Visit Date: 09/10/2018 Occupation: @GUAROCC @  Subjective:  No chief complaint on file.   History of Present Illness: Amber Delgado is a 32 y.o. female ***   Activities of Daily Living:  Patient reports morning stiffness for *** {minute/hour:19697}.   Patient {ACTIONS;DENIES/REPORTS:21021675::"Denies"} nocturnal pain.  Difficulty dressing/grooming: {ACTIONS;DENIES/REPORTS:21021675::"Denies"} Difficulty climbing stairs: {ACTIONS;DENIES/REPORTS:21021675::"Denies"} Difficulty getting out of chair: {ACTIONS;DENIES/REPORTS:21021675::"Denies"} Difficulty using hands for taps, buttons, cutlery, and/or writing: {ACTIONS;DENIES/REPORTS:21021675::"Denies"}  No Rheumatology ROS completed.   PMFS History:  Patient Active Problem List   Diagnosis Date Noted  . White coat syndrome without diagnosis of hypertension 03/23/2018  . Crohn's disease (HCC) 09/23/2013  . Inflammatory arthritis 09/23/2013    Past Medical History:  Diagnosis Date  . Anemia   . Crohn's disease (HCC)   . Inflammatory arthritis     Family History  Problem Relation Age of Onset  . Diabetes Maternal Uncle   . Cancer Maternal Grandfather        unknown type  . Heart attack Maternal Grandfather   . Cancer Paternal Grandfather        unknown type  . Cancer Paternal Grandmother        unknown type  . Heart failure Father   . Hypertension Father   . Healthy Sister    Past Surgical History:  Procedure Laterality Date  . BOWEL RESECTION  3/09   resection of small intestine/ileum removed   Social History   Social History Narrative  . Not on file    Objective: Vital Signs: There were no vitals taken for this visit.   Physical Exam   Musculoskeletal Exam: ***  CDAI Exam: CDAI Score: Not documented Patient Global  Assessment: Not documented; Provider Global Assessment: Not documented Swollen: Not documented; Tender: Not documented Joint Exam   Not documented   There is currently no information documented on the homunculus. Go to the Rheumatology activity and complete the homunculus joint exam.  Investigation: No additional findings.  Imaging: No results found.  Recent Labs: Lab Results  Component Value Date   WBC 5.4 04/09/2018   HGB 13.7 04/09/2018   PLT 228 04/09/2018   NA 142 04/09/2018   K 4.5 04/09/2018   CL 110 04/09/2018   CO2 28 04/09/2018   GLUCOSE 102 (H) 04/09/2018   BUN 16 04/09/2018   CREATININE 0.79 04/09/2018   BILITOT 0.9 04/09/2018   ALKPHOS 31 (L) 02/10/2008   AST 18 04/09/2018   ALT 13 04/09/2018   PROT 7.1 04/09/2018   ALBUMIN 2.6 (L) 02/10/2008   CALCIUM 9.3 04/09/2018   GFRAA 117 04/09/2018    Speciality Comments: No specialty comments available.  Procedures:  No procedures performed Allergies: Penicillins   Assessment / Plan:     Visit Diagnoses: Crohn's disease with complication, unspecified gastrointestinal tract location (HCC)  Inflammatory arthritis - +ANA  High risk medication use - Imuran-Prescribed by Dr. Loreta AveMann  Trochanteric bursitis of right hip  White coat syndrome without diagnosis of hypertension   Orders: No orders of the defined types were placed in this encounter.  No orders of the defined types were placed in this encounter.   Face-to-face time spent with patient was ***  minutes. Greater than 50% of time was spent in counseling and coordination of care.  Follow-Up Instructions: No follow-ups on file.   Ofilia Neas, PA-C  Note - This record has been created using Dragon software.  Chart creation errors have been sought, but may not always  have been located. Such creation errors do not reflect on  the standard of medical care.

## 2018-09-10 ENCOUNTER — Ambulatory Visit: Payer: BLUE CROSS/BLUE SHIELD | Admitting: Physician Assistant

## 2019-01-07 ENCOUNTER — Other Ambulatory Visit: Payer: Self-pay

## 2019-01-07 ENCOUNTER — Ambulatory Visit: Payer: 59 | Admitting: Obstetrics & Gynecology

## 2019-01-07 ENCOUNTER — Other Ambulatory Visit (HOSPITAL_COMMUNITY)
Admission: RE | Admit: 2019-01-07 | Discharge: 2019-01-07 | Disposition: A | Discharge status: Home / Self Care | Payer: 59 | Source: Ambulatory Visit | Attending: Obstetrics & Gynecology | Admitting: Obstetrics & Gynecology

## 2019-01-07 ENCOUNTER — Encounter: Payer: Self-pay | Admitting: Obstetrics & Gynecology

## 2019-01-07 VITALS — BP 130/88 | HR 96 | Resp 18 | Ht 65.0 in | Wt 140.6 lb

## 2019-01-07 DIAGNOSIS — Z124 Encounter for screening for malignant neoplasm of cervix: Secondary | ICD-10-CM | POA: Diagnosis not present

## 2019-01-07 DIAGNOSIS — Z01419 Encounter for gynecological examination (general) (routine) without abnormal findings: Secondary | ICD-10-CM | POA: Diagnosis not present

## 2019-01-07 DIAGNOSIS — Z0000 Encounter for general adult medical examination without abnormal findings: Secondary | ICD-10-CM | POA: Diagnosis not present

## 2019-01-07 MED ORDER — NORETHINDRONE 0.35 MG PO TABS: 1.0000 | ORAL_TABLET | Freq: Every day | ORAL | 4 refills | Status: DC

## 2019-01-07 NOTE — Progress Notes (Signed)
33 y.o. G0P0000 Single White or Caucasian female here for annual exam.  Doing well.  Exercising regularly.  Runs 3-5 miles 3-4 times a week.  Had some hip pain in May of last year.  Saw Dr. Corliss Skainseveshwar due to hip pain.  X ray was negative.  She had a cortisone injection and this resolved the issues.  They think it was just bursitis.  Hasn't had follow up with Dr. Loreta AveMann since 2018 when she had last colonoscopy.   Had CT around the same time with 2.5cm area of inflammation.    She takes her blood pressures at home and brought these to me today.  The rang is 103-116/58-70.  These have been taken over the past year.    Takes micronor for contraception.  Does skip cycles some months.  Knows this is common with the contraception.    Stopped her Imuran as she didn't think it wasn't doing anything for her.  She has gone to a vegan diet and has really felt this helps.  Patient's last menstrual period was 12/24/2018 (exact date).          Sexually active: No.  The current method of family planning is oral progesterone-only contraceptive.    Exercising: Yes.    cardio, strength  Smoker:  no  Health Maintenance: Pap:  12/28/17 Neg   12/23/16 Neg  History of abnormal Pap:  no MMG:  Never Colonoscopy: 2018 Crohn's disease.   TDaP:  2015 Screening Labs: here to day - fasting    reports that she has never smoked. She has never used smokeless tobacco. She reports current alcohol use of about 2.0 standard drinks of alcohol per week. She reports that she does not use drugs.  Past Medical History:  Diagnosis Date  . Anemia   . Crohn's disease (HCC)   . Inflammatory arthritis     Past Surgical History:  Procedure Laterality Date  . BOWEL RESECTION  3/09   resection of small intestine/ileum removed    Current Outpatient Medications  Medication Sig Dispense Refill  . Ginger, Zingiber officinalis, (GINGER PO) Take by mouth as needed.     . norethindrone (MICRONOR,CAMILA,ERRIN) 0.35 MG tablet Take 1  tablet (0.35 mg total) by mouth daily. 3 Package 4  . Omega-3 Fatty Acids (FISH OIL PO) Take by mouth daily.      No current facility-administered medications for this visit.     Family History  Problem Relation Age of Onset  . Diabetes Maternal Uncle   . Cancer Maternal Grandfather        unknown type  . Heart attack Maternal Grandfather   . Cancer Paternal Grandfather        unknown type  . Cancer Paternal Grandmother        unknown type  . Heart failure Father   . Hypertension Father   . Healthy Sister     Review of Systems  All other systems reviewed and are negative.   Exam:   BP 130/88 (BP Location: Left Arm, Patient Position: Sitting, Cuff Size: Normal)   Pulse 96   Resp 18   Ht 5\' 5"  (1.651 m)   Wt 140 lb 9.6 oz (63.8 kg)   LMP 12/24/2018 (Exact Date)   BMI 23.40 kg/m   Height:   Height: 5\' 5"  (165.1 cm)  Ht Readings from Last 3 Encounters:  01/07/19 5\' 5"  (1.651 m)  04/09/18 5\' 5"  (1.651 m)  03/23/18 5\' 5"  (1.651 m)    General appearance:  alert, cooperative and appears stated age Head: Normocephalic, without obvious abnormality, atraumatic Neck: no adenopathy, supple, symmetrical, trachea midline and thyroid normal to inspection and palpation Lungs: clear to auscultation bilaterally Breasts: normal appearance, no masses or tenderness Heart: regular rate and rhythm Abdomen: soft, non-tender; bowel sounds normal; no masses,  no organomegaly Extremities: extremities normal, atraumatic, no cyanosis or edema Skin: Skin color, texture, turgor normal. No rashes or lesions Lymph nodes: Cervical, supraclavicular, and axillary nodes normal. No abnormal inguinal nodes palpated Neurologic: Grossly normal   Pelvic: External genitalia:  no lesions              Urethra:  normal appearing urethra with no masses, tenderness or lesions              Bartholins and Skenes: normal                 Vagina: normal appearing vagina with normal color and discharge, no  lesions              Cervix: no lesions              Pap taken: Yes.   Bimanual Exam:  Uterus:  normal size, contour, position, consistency, mobility, non-tender              Adnexa: normal adnexa and no mass, fullness, tenderness               Rectovaginal: Confirms               Anus:  normal sphincter tone, no lesions  Chaperone was present for exam.  A:  Well Woman with normal exam White coat hypertension H/o Crohn's disease.  Hasn't seen Dr. Loreta Ave since 2018.  Last colonoscopy was 3/18. H/o inflammatory arthritis  P:   Mammogram guidelines reviewed.  Not needed until age 36 pap smear obtained today Lipids, TSH and Vit D will be obtained today Has completed Gardisil vaccination series Does not have current plans for GI follow-up as she feels she is doing really well Return annually or prn

## 2019-01-08 LAB — LIPID PANEL
CHOL/HDL RATIO: 2.7 ratio (ref 0.0–4.4)
Cholesterol, Total: 136 mg/dL (ref 100–199)
HDL: 51 mg/dL (ref >39)
LDL Calculated: 76 mg/dL (ref 0–99)
Triglycerides: 45 mg/dL (ref 0–149)
VLDL Cholesterol Cal: 9 mg/dL (ref 5–40)

## 2019-01-08 LAB — TSH: TSH: 1.62 u[IU]/mL (ref 0.450–4.500)

## 2019-01-08 LAB — VITAMIN D 25 HYDROXY (VIT D DEFICIENCY, FRACTURES): Vit D, 25-Hydroxy: 33.2 ng/mL (ref 30.0–100.0)

## 2019-01-10 LAB — CYTOLOGY - PAP: DIAGNOSIS: NEGATIVE

## 2020-03-18 ENCOUNTER — Other Ambulatory Visit: Payer: Self-pay | Admitting: Obstetrics & Gynecology

## 2020-03-19 NOTE — Telephone Encounter (Signed)
Medication refill request: Micronor Last AEX:  01/07/2019 Next AEX: 05/17/2020 Last MMG (if hormonal medication request): n/a Refill authorized: #[id number]   Please advise and refill if appropriate.

## 2020-05-17 ENCOUNTER — Ambulatory Visit: Payer: 59 | Admitting: Obstetrics & Gynecology

## 2020-06-11 ENCOUNTER — Other Ambulatory Visit: Payer: Self-pay | Admitting: Obstetrics & Gynecology

## 2021-07-09 ENCOUNTER — Ambulatory Visit: Payer: Self-pay
